# Patient Record
Sex: Female | Born: 1985 | Race: White | Hispanic: No | Marital: Married | Smoking: Never smoker
Health system: Southern US, Community
[De-identification: ages and names within clinical notes are randomized; demographics above are authoritative.]

## PROBLEM LIST (undated history)

## (undated) DIAGNOSIS — Z803 Family history of malignant neoplasm of breast: Secondary | ICD-10-CM

## (undated) DIAGNOSIS — F419 Anxiety disorder, unspecified: Secondary | ICD-10-CM

## (undated) DIAGNOSIS — G4700 Insomnia, unspecified: Secondary | ICD-10-CM

## (undated) DIAGNOSIS — L732 Hidradenitis suppurativa: Secondary | ICD-10-CM

## (undated) DIAGNOSIS — F32A Depression, unspecified: Secondary | ICD-10-CM

## (undated) DIAGNOSIS — F909 Attention-deficit hyperactivity disorder, unspecified type: Secondary | ICD-10-CM

## (undated) HISTORY — DX: Depression, unspecified: F32.A

## (undated) HISTORY — DX: Attention-deficit hyperactivity disorder, unspecified type: F90.9

## (undated) HISTORY — DX: Insomnia, unspecified: G47.00

## (undated) HISTORY — DX: Hidradenitis suppurativa: L73.2

## (undated) HISTORY — PX: LAPAROSCOPIC ABLATION OF UTERINE FIBROIDS WITH ACESSA: SHX7022

## (undated) HISTORY — DX: Anxiety disorder, unspecified: F41.9

## (undated) HISTORY — DX: Family history of malignant neoplasm of breast: Z80.3

---
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
## 2004-12-20 ENCOUNTER — Ambulatory Visit: Payer: Self-pay | Admitting: Internal Medicine

## 2005-01-03 ENCOUNTER — Ambulatory Visit: Payer: Self-pay | Admitting: Internal Medicine

## 2005-04-25 ENCOUNTER — Ambulatory Visit: Payer: Self-pay | Admitting: Internal Medicine

## 2005-05-16 ENCOUNTER — Ambulatory Visit: Payer: Self-pay | Admitting: Internal Medicine

## 2005-06-19 ENCOUNTER — Ambulatory Visit: Payer: Self-pay | Admitting: Internal Medicine

## 2005-06-19 ENCOUNTER — Encounter: Admission: RE | Admit: 2005-06-19 | Discharge: 2005-06-19 | Payer: Self-pay | Admitting: Internal Medicine

## 2005-08-15 ENCOUNTER — Ambulatory Visit: Payer: Self-pay | Admitting: Internal Medicine

## 2005-10-19 ENCOUNTER — Ambulatory Visit (HOSPITAL_COMMUNITY): Admission: RE | Admit: 2005-10-19 | Discharge: 2005-10-19 | Payer: Self-pay | Admitting: *Deleted

## 2005-11-10 ENCOUNTER — Ambulatory Visit (HOSPITAL_COMMUNITY): Admission: RE | Admit: 2005-11-10 | Discharge: 2005-11-10 | Payer: Self-pay | Admitting: *Deleted

## 2006-01-01 ENCOUNTER — Inpatient Hospital Stay (HOSPITAL_COMMUNITY): Admission: AD | Admit: 2006-01-01 | Discharge: 2006-01-01 | Payer: Self-pay | Admitting: Obstetrics and Gynecology

## 2006-04-05 ENCOUNTER — Inpatient Hospital Stay (HOSPITAL_COMMUNITY): Admission: AD | Admit: 2006-04-05 | Discharge: 2006-04-06 | Payer: Self-pay | Admitting: Obstetrics and Gynecology

## 2006-04-06 ENCOUNTER — Inpatient Hospital Stay (HOSPITAL_COMMUNITY): Admission: AD | Admit: 2006-04-06 | Discharge: 2006-04-09 | Payer: Self-pay | Admitting: Obstetrics and Gynecology

## 2006-09-17 ENCOUNTER — Ambulatory Visit: Payer: Self-pay | Admitting: Internal Medicine

## 2007-02-15 ENCOUNTER — Ambulatory Visit: Payer: Self-pay | Admitting: Internal Medicine

## 2007-04-01 IMAGING — US US OB FOLLOW-UP
1 series · 13 of 28 positions shown · non-contrast
Comparison: none

CLINICAL DATA: 18 week 3 day assigned gestational age by prior ultrasound.  Inaccurate LMP.  Evaluate fetal growth and anatomy.

[Series 1: us ob follow-up · 0.27mm/px · 13 of 94 slices shown]
[im 4/94]
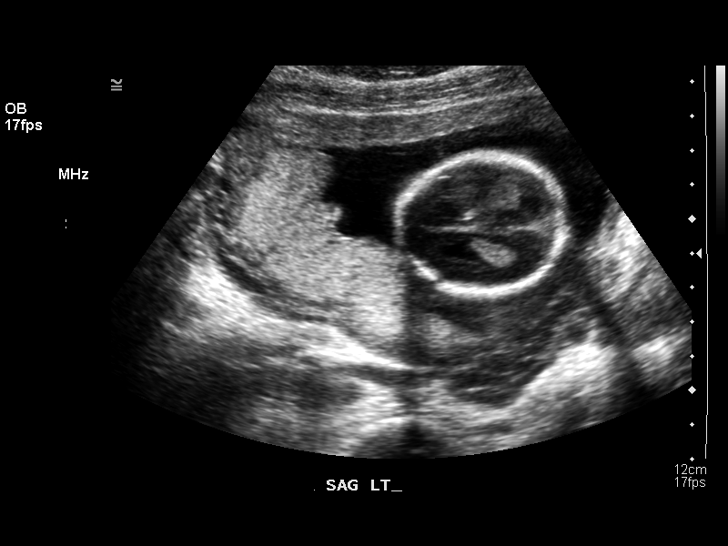
[im 11/94]
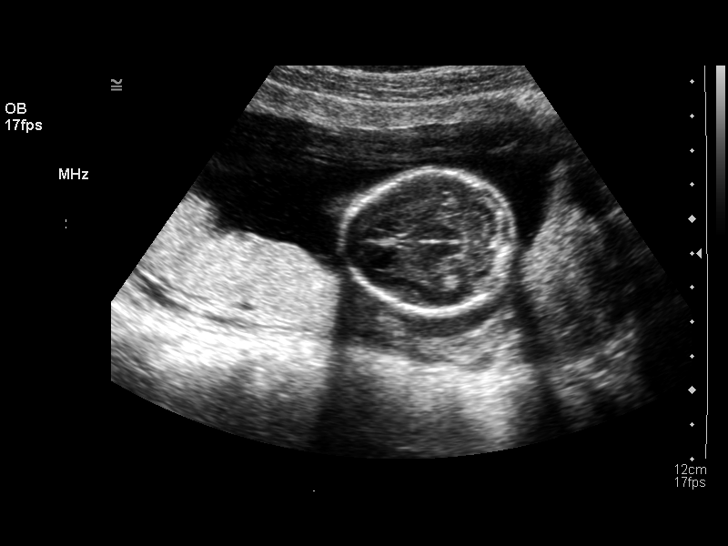
[im 18/94]
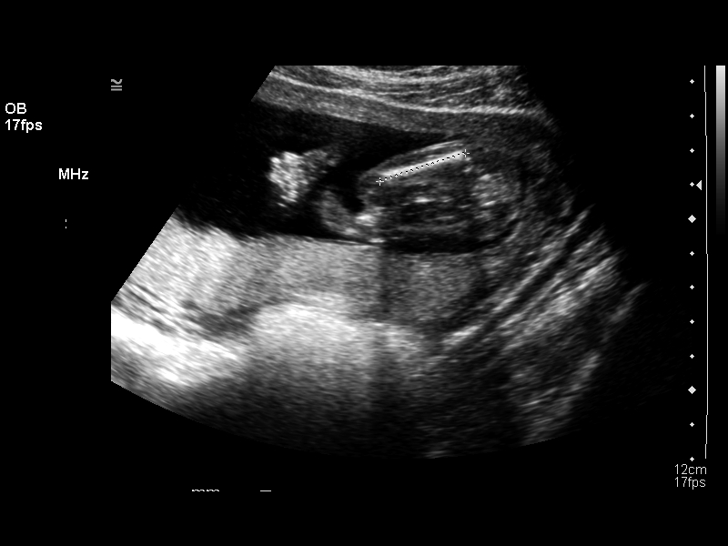
[im 25/94]
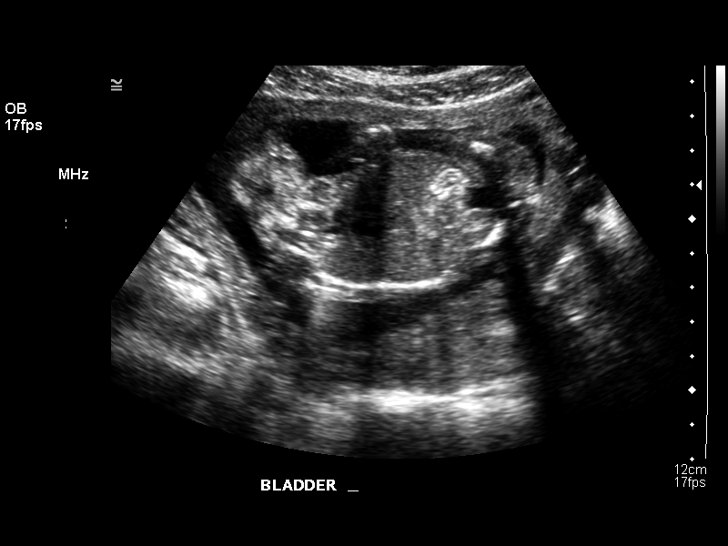
[im 32/94]
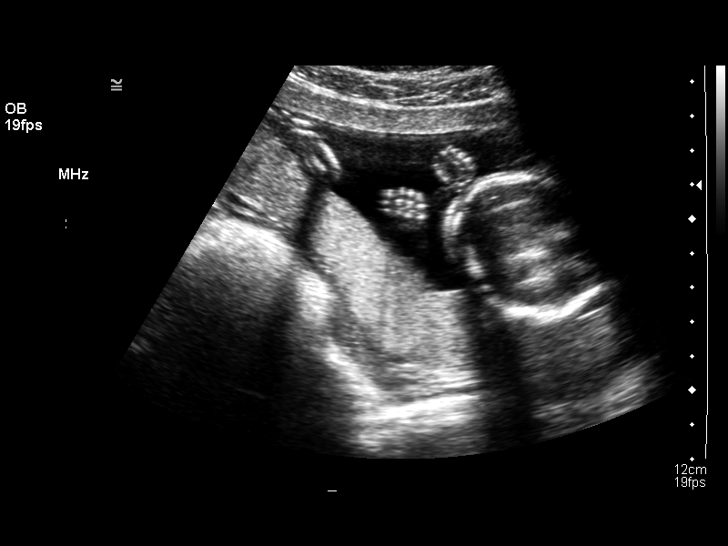
[im 38/94]
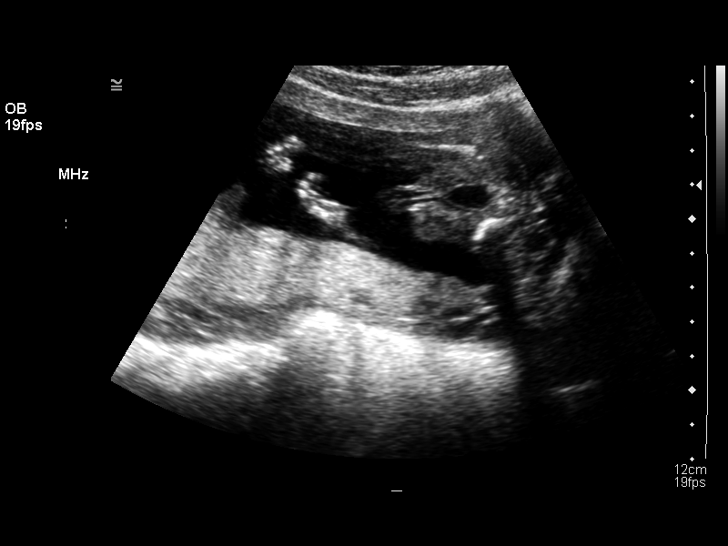
[im 49/94]
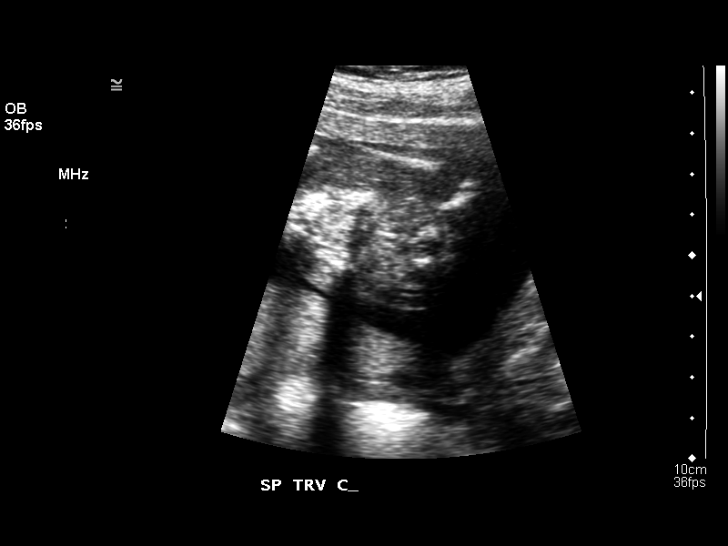
[im 56/94]
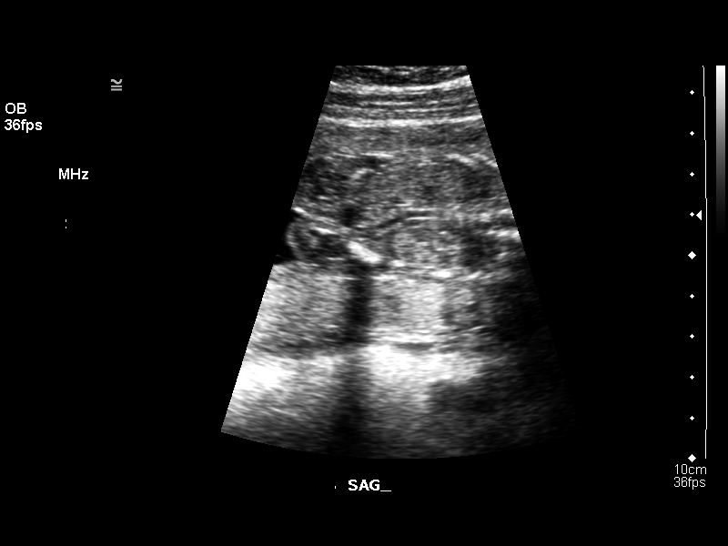
[im 63/94]
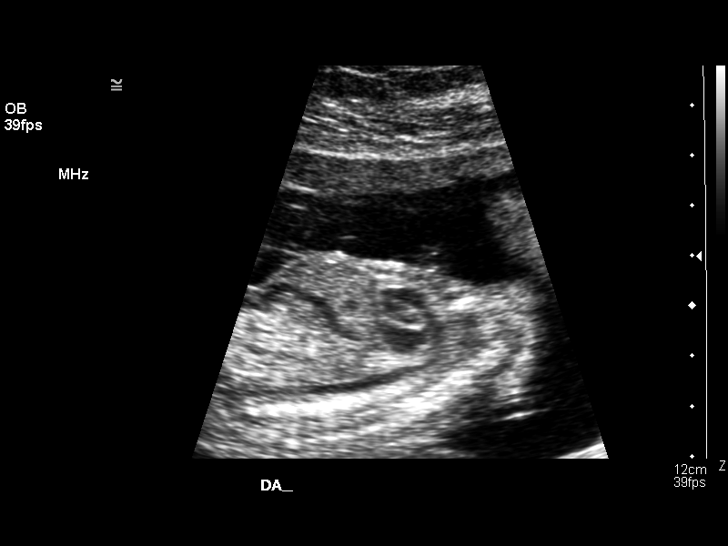
[im 69/94]
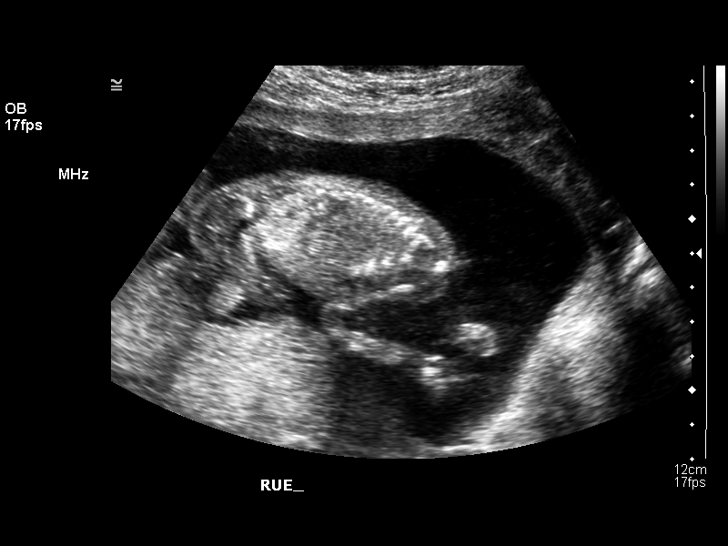
[im 76/94]
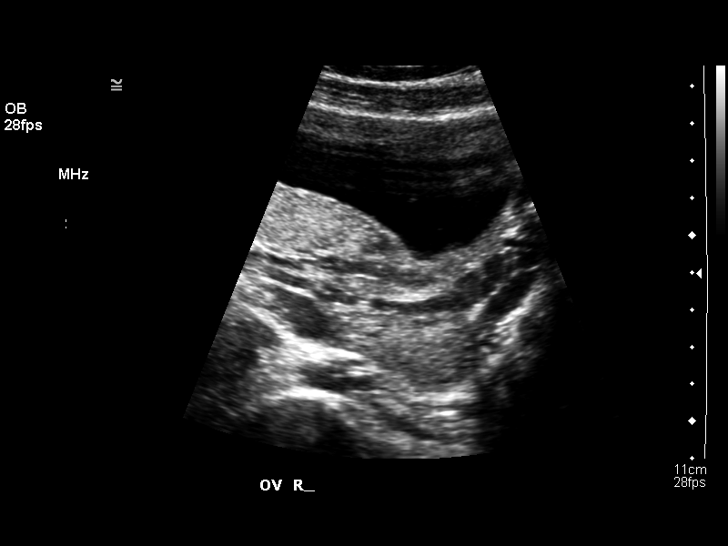
[im 83/94]
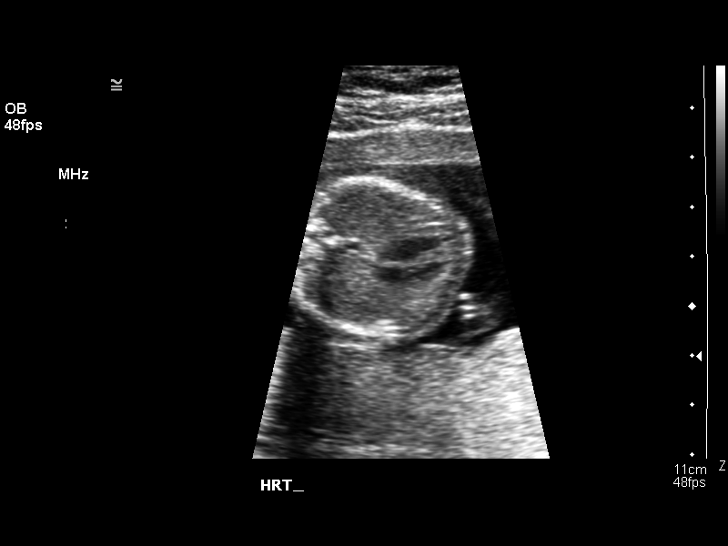
[im 90/94]
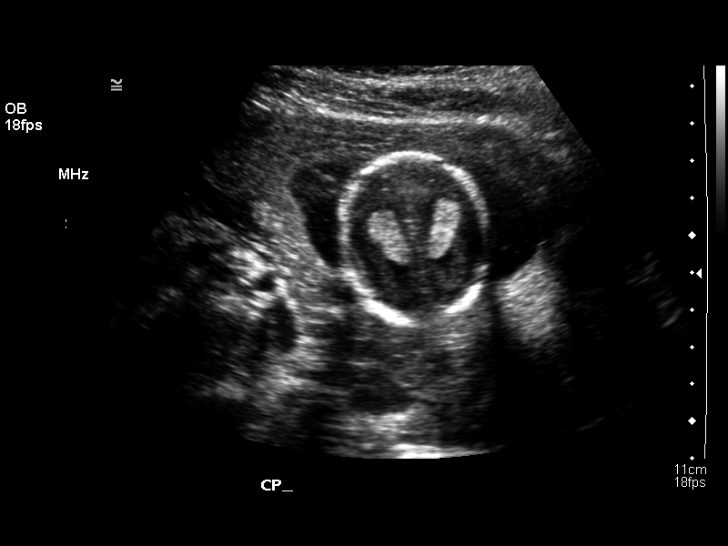

[13 of 28 positions shown; findings below may reference images not displayed]

OBSTETRICAL ULTRASOUND RE-EVALUATION:
 Number of Fetuses:  1
 Heart Rate:  133
 Movement:  Yes
 Breathing:  No
 Presentation:  Cephalic
 Placental Location:  Fundal, posterior
 Grade:  I
 Previa:  No
 Amniotic Fluid (subjective):  Normal
 Amniotic Fluid (objective):  4.5 cm Vertical pocket 

 FETAL BIOMETRY
 BPD:  4.0 cm  18 w 2 d
 HC:  14.9 cm  18 w 0 d
 AC:   12.9 cm   18 w 3 d
 FL:   2.6 cm  18 w 1 d

 Mean GA:  18 w 2 d
 Assigned GA:  18 w 3 d

 FETAL ANATOMY
 Lateral Ventricles:  Visualized 
 Thalami/CSP:  Visualized 
 Posterior Fossa:  Visualized 
 Nuchal Region:  Visualized 
 Spine:  Visualized 
 4 Chamber Heart on Left:  Visualized 
 Stomach on Left:  Visualized 
 3 Vessel Cord:  Visualized 
 Cord Insertion Site:  Visualized 
 Kidneys:  Visualized 
 Bladder:  Visualized   
 Extremities:  Visualized 

 ADDITIONAL ANATOMY VISUALIZED:  LVOT, RVOT, upper lip, orbits, profile, diaphragm, heel, 5th digit, ductal arch, aortic arch, and female genitalia.

 MATERNAL UTERINE AND ADNEXAL FINDINGS
 Cervix:  4.0 cm Transabdominally.  Both ovaries are unremarkable.
IMPRESSION: 1.  Assigned gestational age by prior ultrasound is currently 18 weeks 3 days.  Appropriate fetal growth.  
 2.  No evidence of fetal anatomic abnormality.

## 2007-07-20 ENCOUNTER — Ambulatory Visit: Payer: Self-pay | Admitting: Family Medicine

## 2007-10-04 ENCOUNTER — Ambulatory Visit: Payer: Self-pay | Admitting: Internal Medicine

## 2007-10-04 DIAGNOSIS — R599 Enlarged lymph nodes, unspecified: Secondary | ICD-10-CM | POA: Insufficient documentation

## 2007-10-04 DIAGNOSIS — M549 Dorsalgia, unspecified: Secondary | ICD-10-CM | POA: Insufficient documentation

## 2007-10-04 DIAGNOSIS — R109 Unspecified abdominal pain: Secondary | ICD-10-CM | POA: Insufficient documentation

## 2007-10-04 LAB — CONVERTED CEMR LAB
Glucose, Urine, Semiquant: NEGATIVE
Ketones, urine, test strip: NEGATIVE
Nitrite: NEGATIVE
Protein, U semiquant: NEGATIVE
Specific Gravity, Urine: 1.03
Urobilinogen, UA: 0.2
pH: 6

## 2007-10-09 ENCOUNTER — Telehealth: Payer: Self-pay | Admitting: *Deleted

## 2008-03-30 ENCOUNTER — Ambulatory Visit: Payer: Self-pay | Admitting: Internal Medicine

## 2008-06-12 ENCOUNTER — Ambulatory Visit: Payer: Self-pay | Admitting: Internal Medicine

## 2008-06-12 DIAGNOSIS — D485 Neoplasm of uncertain behavior of skin: Secondary | ICD-10-CM | POA: Insufficient documentation

## 2008-06-12 DIAGNOSIS — L708 Other acne: Secondary | ICD-10-CM | POA: Insufficient documentation

## 2008-10-29 ENCOUNTER — Ambulatory Visit: Payer: Self-pay | Admitting: Genetic Counselor

## 2009-12-29 ENCOUNTER — Ambulatory Visit: Payer: Self-pay | Admitting: Internal Medicine

## 2009-12-29 LAB — CONVERTED CEMR LAB
Bilirubin Urine: NEGATIVE
Blood in Urine, dipstick: NEGATIVE
Glucose, Urine, Semiquant: NEGATIVE
Ketones, urine, test strip: NEGATIVE
Nitrite: NEGATIVE
Protein, U semiquant: NEGATIVE
Specific Gravity, Urine: 1.025
Urobilinogen, UA: 0.2
WBC Urine, dipstick: NEGATIVE
pH: 5.5

## 2009-12-30 ENCOUNTER — Encounter: Payer: Self-pay | Admitting: Internal Medicine

## 2010-06-13 ENCOUNTER — Ambulatory Visit: Payer: Self-pay | Admitting: Internal Medicine

## 2010-06-13 DIAGNOSIS — M545 Low back pain, unspecified: Secondary | ICD-10-CM | POA: Insufficient documentation

## 2011-01-17 NOTE — Assessment & Plan Note (Signed)
Summary: back pain//ccm   Vital Signs:  Patient profile:   25 year old female Menstrual status:  regular Weight:      124 pounds Temp:     98.2 degrees F oral Pulse rate:   88 / minute BP sitting:   100 / 60  (right arm) Cuff size:   regular  Vitals Entered By: Romualdo Bolk, CMA (AAMA) (June 13, 2010 3:57 PM) CC: Back Spasms x 8 days from mid back down. Pt states that lower back is the worse part. No UTI sx.    History of Present Illness: Samantha Webster comes in today  for sda acute  problem. Back problems off and on for the last 10 years  this  last week flare and heard  a pop reading for shampoo and then reaching for  Newpaper.  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 No fever weight loss unintentional . pain    worse when moves in the night.  standing better  worse with certain sitting.  Dr Phineas Semen   did an eval in the remote past.     celebrex  has been used in the past.   Preventive Screening-Counseling & Management  Alcohol-Tobacco     Alcohol drinks/day: 1     Alcohol type: wine     Smoking Status: never  Caffeine-Diet-Exercise     Caffeine use/day: 1     Does Patient Exercise: no  Current Medications (verified): 1)  Lamictal 100 Mg  Tabs (Lamotrigine) .... Daily Am 2)  Alprazolam 1 Mg  Tabs (Alprazolam) .... Daily At [facility] 3)  Bactrim 400-80 Mg Tabs (Sulfamethoxazole-Trimethoprim) .Marland Kitchen.. 1 Two Times A Day 4)  Mirena 20 Mcg/24hr Iud (Levonorgestrel)  Allergies (verified): 1)  ! * Pcn-Amoxicillin  Past History:  Past medical, surgical, family and social histories (including risk factors) reviewed, and no changes noted (except as noted below).  Past Medical History: Reviewed history from 12/29/2009 and no changes required. childbirth 4/07 back pain serology hla b27 hnp l5s1 depression /add /anxiety  hx of   accutane med    Past Surgical History: Reviewed history from 10/04/2007 and no  changes required. c section  Past History:  Care Management: Dermatology: Terri Piedra Neurosurgery : [facility].   Family History: Reviewed history from 10/04/2007 and no changes required. Family History Breast cancer 1st degree relative <50 mom died from premenopausal breast cancer.  Social History: Reviewed history from 12/29/2009 and no changes required. 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  Review of Systems  The patient denies anorexia, fever, weight loss, weight gain, vision loss, decreased hearing, headaches, abdominal pain, severe indigestion/heartburn, hematuria, incontinence, genital sores, muscle weakness, transient blindness, enlarged lymph nodes, and angioedema.    Physical Exam  General:  Well-developed,well-nourished,in no acute distress; alert,appropriate and cooperative throughout examination prefers to stand Head:  normocephalic and atraumatic.   Eyes:  vision grossly intact.   Neck:  No deformities, masses, or tenderness noted. Lungs:  normal respiratory effort and no intercostal retractions.   Heart:  normal rate and regular rhythm.   Msk:  back no bony tenderness  tender al lb adn si area also  nl heel toe walk and nl strength and no  joint swelling  Extremities:  no clubbing cyanosis or edema  Neurologic:  alert & oriented X3, strength normal in all extremities, and DTRs symmetrical and normal.   neg slr  Skin:  turgor normal, color normal, and no rashes.   Cervical Nodes:  No lymphadenopathy noted Psych:  Oriented X3, not anxious appearing, and not depressed appearing.     Impression & Recommendations:  Problem # 1:  LOW BACK PAIN, ACUTE (ICD-724.2) hx of same but this  lasting longer    no other alarm signs . has used nsaid   will try pred   and further eval if  persistent or  progressive  She does have hla b27 but   mri in the past did not show   problem with si.  also no other systemic symptom   Problem # 2:  ? of BIPOLAR II DISORDER (ICD-296.89) stable   disc prednisone  and side effect possible .   Complete Medication List: 1)  Lamictal 100 Mg Tabs (Lamotrigine) .... Daily am 2)  Alprazolam 1 Mg Tabs (Alprazolam) .... Daily at hs 3)  Bactrim 400-80 Mg Tabs (Sulfamethoxazole-trimethoprim) .Marland Kitchen.. 1 two times a day 4)  Mirena 20 Mcg/24hr Iud (Levonorgestrel) 5)  Prednisone 20 Mg Tabs (Prednisone) .... Take 3 a day x 3 days then 2 a day x3 days then 1/day x 3 days, then 1/2 a day x 3 days or as directed.  Patient Instructions: 1)  try prednisone as an antiinflammatory  for your back and thus to help with the pain.  2)  avoid sitting    and  some lifting   3)  If not significantly better in the next 2 weeks then  call  for further advice  Prescriptions: PREDNISONE 20 MG TABS (PREDNISONE) Take 3 a day x 3 days then 2 a day x3 days then 1/day x 3 days, then 1/2 a day x 3 days or as directed.  #30 x 0   Entered and Authorized by:   Madelin Headings MD   Signed by:   Madelin Headings MD on 06/13/2010   Method used:   Electronically to        [facility] [phone]* (retail)       [address]       [place], [place]  [address]       Ph: [phone]       Fax: [phone]   RxID:   [id number]

## 2011-01-17 NOTE — Assessment & Plan Note (Signed)
Summary: uti/discomfort/cjr   Vital Signs:  Patient profile:   25 year old female Menstrual status:  regular LMP:     12/08/2009 Height:      63 inches Weight:      128 pounds BMI:     22.76 Temp:     98.2 degrees F oral Pulse rate:   72 / minute BP sitting:   100 / 60  (right arm) Cuff size:   regular  Vitals Entered By: Romualdo Bolk, CMA (AAMA) (December 29, 2009 1:52 PM) CC: Lower abd. pain that is going up. Alot of pressure since 1/10.  LMP (date): 12/08/2009     Menstrual Status regular Enter LMP: 12/08/2009   History of Present Illness: Samantha Webster comesin today  for  SDA  because of acute onset yesterday  of lovwer abd pain that she thinks is a bladder infection...this pain   doubled her over.  similar to last bladder  infection. No dysuria or frequency currently.   periods  ok on mirena and no vaginal symptom .  HAs reg gyne checks Pain began lower abd and then moved to middle . now is just tender. not as bad . ? if any change in bowel habits , no blood.  No meds taken for  treatment. Pattern is waxing and waning. On no new meds advil etc   Is on Bactrim  for acne  .  Since last visit she had lost her insurance and was on Accutane and stopped because of cost but no abd pain with this. now on antibiotic rx.    NO fever flank pain NVD. NO hematuria or hx of kidney infection.   Preventive Screening-Counseling & Management  Alcohol-Tobacco     Alcohol drinks/day: 1     Alcohol type: wine     Smoking Status: never  Caffeine-Diet-Exercise     Caffeine use/day: 1     Does Patient Exercise: no  Current Medications (verified): 1)  Lamictal 100 Mg  Tabs (Lamotrigine) .... Daily Am 2)  Alprazolam 1 Mg  Tabs (Alprazolam) .... Daily At [facility] 3)  Bactrim 400-80 Mg Tabs (Sulfamethoxazole-Trimethoprim) .Marland Kitchen.. 1 Two Times A Day  Allergies (verified): 1)  ! * Pcn-Amoxicillin  Past History:  Past medical, surgical, family and social histories (including risk factors)  reviewed, and no changes noted (except as noted below).  Past Medical History: childbirth 4/07 back pain serology hla b27 hnp l5s1 depression /add /anxiety  hx of   accutane med    Past Surgical History: Reviewed history from 10/04/2007 and no changes required. c section  Past History:  Care Management: Dermatology: Terri Piedra  Family History: Reviewed history from 10/04/2007 and no changes required. Family History Breast cancer 1st degree relative <50 mom died from premenopausal breast cancer.  Social History: Reviewed history from 06/12/2008 and no changes required. 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 Caffeine use/day:  1 Does Patient Exercise:  no  Review of Systems  The patient denies anorexia, fever, weight loss, weight gain, vision loss, decreased hearing, hoarseness, chest pain, headaches, melena, hematochezia, severe indigestion/heartburn, hematuria, incontinence, genital sores, muscle weakness, depression, unusual weight change, abnormal bleeding, enlarged lymph nodes, and angioedema.    Physical Exam  General:  Well-developed,well-nourished,in no acute distress; alert,appropriate and cooperative throughout examination Head:  normocephalic and atraumatic.  Eyes:  vision grossly intact and pupils equal.   Ears:  no external deformities.   Neck:  No deformities, masses, or tenderness noted. Lungs:  normal respiratory effort and no intercostal retractions.   Heart:  normal rate and regular rhythm.   Abdomen:  no flank pain    points  to   mid lower abdomen  no tenderness or rebound  no distention, no hepatomegaly, and no splenomegaly.   Pulses:  nl cap refill  Extremities:  no clubbing cyanosis or edema  Neurologic:  non focal grossly Skin:  faded acne on face  Cervical Nodes:  No lymphadenopathy noted Psych:  Oriented X3, good eye  contact, not anxious appearing, and not depressed appearing.     Impression & Recommendations:  Problem # 1:  ABDOMINAL PAIN (ICD-789.00) atypical for uti but pt states last uti acted the same way  . no vag or  other associated  symptom .  if continuing  and neg culture may need further evaluation.   Orders: T-Culture, Urine ([address]) UA Dipstick w/o Micro (automated)  (81003)  Problem # 2:  ACNE, CYSTIC (ICD-706.1)  Her updated medication list for this problem includes:    Bactrim 400-80 Mg Tabs (Sulfamethoxazole-trimethoprim) .Marland Kitchen... 1 two times a day    Cipro 500 Mg Tabs (Ciprofloxacin hcl) .Marland Kitchen... 1 by mouth two times a day if needed for uti  Problem # 3:  mood  stable on medication  sees psych.  Complete Medication List: 1)  Lamictal 100 Mg Tabs (Lamotrigine) .... Daily am 2)  Alprazolam 1 Mg Tabs (Alprazolam) .... Daily at hs 3)  Bactrim 400-80 Mg Tabs (Sulfamethoxazole-trimethoprim) .Marland Kitchen.. 1 two times a day 4)  Cipro 500 Mg Tabs (Ciprofloxacin hcl) .Marland Kitchen.. 1 by mouth two times a day if needed for uti 5)  Mirena 20 Mcg/24hr Iud (Levonorgestrel)  Patient Instructions: 1)  You will be informed of lab results when available.  2)   if progressing can begin  antibiotic for UTI. 3)  If getting diarrhea it is probably a stomach virus that antibiotics wont  help.  Call and keeus informed.  Prescriptions: CIPRO 500 MG TABS (CIPROFLOXACIN HCL) 1 by mouth two times a day if needed for UTI  #10 x 0   Entered and Authorized by:   Madelin Headings MD   Signed by:   Madelin Headings MD on 12/29/2009   Method used:   Print then Give to Patient   RxID:   [id number]   Laboratory Results   Urine Tests    Routine Urinalysis   Color: brown Appearance: Cloudy Glucose: negative   (Normal Range: Negative) Bilirubin: negative   (Normal Range: Negative) Ketone: negative   (Normal Range: Negative) Spec. Gravity: 1.025   (Normal Range: 1.003-1.035) Blood: negative   (Normal Range:  Negative) pH: 5.5   (Normal Range: 5.0-8.0) Protein: negative   (Normal Range: Negative) Urobilinogen: 0.2   (Normal Range: 0-1) Nitrite: negative   (Normal Range: Negative) Leukocyte Esterace: negative   (Normal Range: Negative)    Comments: Joanne Chars CMA  December 29, 2009 2:11 PM

## 2011-02-09 ENCOUNTER — Telehealth: Payer: Self-pay | Admitting: Internal Medicine

## 2011-02-09 NOTE — Telephone Encounter (Signed)
Triage vm----having another flare up from her herniated disc. Requesting rx for a muscle relaxer. She doesn't feel safe driving. Call [facility].       Her cell number---[phone]. Please return her call.

## 2011-02-09 NOTE — Telephone Encounter (Signed)
Left a message for pt to call back. Pt was never rx'd a muscle relaxer. We need more info.

## 2011-02-10 ENCOUNTER — Encounter: Payer: Self-pay | Admitting: Internal Medicine

## 2011-02-10 ENCOUNTER — Ambulatory Visit (INDEPENDENT_AMBULATORY_CARE_PROVIDER_SITE_OTHER): Payer: 59 | Admitting: Internal Medicine

## 2011-02-10 DIAGNOSIS — M549 Dorsalgia, unspecified: Secondary | ICD-10-CM

## 2011-02-10 MED ORDER — PREDNISONE 10 MG PO TABS: ORAL_TABLET | ORAL | Status: DC

## 2011-02-10 MED ORDER — CYCLOBENZAPRINE HCL 5 MG PO TABS: 5.0000 mg | ORAL_TABLET | Freq: Three times a day (TID) | ORAL | Status: AC | PRN

## 2011-02-10 MED ORDER — HYDROCODONE-ACETAMINOPHEN 5-500 MG PO TABS: 1.0000 | ORAL_TABLET | ORAL | Status: AC | PRN

## 2011-02-10 NOTE — Progress Notes (Signed)
  Subjective:    Patient ID: AVIVA WOLFER, female    DOB: 1986-07-30, 25 y.o.   MRN: [id number]  HPI  Pt presents to clinic for evaluation of back pain. Chart review indicates h/o chronic intermittent back pain. Pt states third episode of lbp within 2 months. Now with 3 day h/o lbp L>R with slight radiation to right upper leg. Notices intermittent mild numbness of upper right leg but denies focal weakness. No recent injury or trauma. Pain worsens with movement. Attempted nsaids as well as topical heat/ice without significant improvement. Denies other complaint.  Reviewed PMH, medications, and allergies.    Review of Systems  Musculoskeletal: Positive for back pain. Negative for joint swelling, arthralgias and gait problem.       Objective:   Physical Exam  Constitutional: She appears well-developed. No distress.  HENT:  Head: Normocephalic and atraumatic.  Right Ear: External ear normal.  Left Ear: External ear normal.  Nose: Nose normal.  Eyes: Conjunctivae are normal. No scleral icterus.  Musculoskeletal:       No midline ls tenderness or bony abnormality. No current overt paraspinal muscle spasm. Gait normal. Modified slr neg. Bilateral LE strength 5/5  Neurological: She is alert.  Skin: Skin is warm and dry. No rash noted. She is not diaphoretic. No erythema.  Psychiatric: She has a normal mood and affect.          Assessment & Plan:

## 2011-02-10 NOTE — Assessment & Plan Note (Signed)
Attempt prednisone taper as prescribed. Flexeril prn as well as short course of vicodin prn. Cautioned regarding possible sedating effect. Followup if no improvement or worsening.

## 2011-02-17 NOTE — Telephone Encounter (Signed)
Left message to call back  

## 2011-02-21 NOTE — Telephone Encounter (Signed)
Pt never called back.

## 2011-05-05 NOTE — Op Note (Signed)
NAME:  Samantha Webster, Samantha Webster           ACCOUNT NO.:  [id number]   MEDICAL RECORD NO.:  [id number]          PATIENT TYPE:  INP   LOCATION:  9113                          FACILITY:  [facility]   PHYSICIAN:  Michelle L. Grewal, M.D.DATE OF BIRTH:  12/02/1986   DATE OF PROCEDURE:  04/06/2006  DATE OF DISCHARGE:                                 OPERATIVE REPORT   PREOPERATIVE DIAGNOSIS:  Intrauterine pregnancy at term and arrest of  descent.   POSTOPERATIVE DIAGNOSIS:  Intrauterine pregnancy at term and arrest of  descent.   PROCEDURE:  Primary low transverse cesarean section.   SURGEON:  Michelle L. Vincente Poli, M.D.   ANESTHESIA:  Epidural.   SPECIMENS:  Female infant, Apgars 8 at 1 and 9 at 5 minutes.   ESTIMATED BLOOD LOSS:  500 cc.   COMPLICATIONS:  None.   PROCEDURE:  Patient was taken to the operating room. Her epidural was dosed.  She was then placed in the supine position with a leftward tilt. She was  prepped and draped in the usual sterile fashion. Foley catheter had  previously been placed on labor and delivery.  A low transverse incision was  made and carried down to the fascia. Fascia was scored in the midline and  extended laterally. The rectus muscles were separated in the midline. The  peritoneum was entered bluntly. Peritoneal incision was then stretched. The  bladder blade was inserted. The lower uterine segment was identified. The  bladder flap was created sharply and then digitally. The bladder blade was  readjusted. A low transverse incision was made in the uterus. Uterus was  entered using a hemostat. The baby was in OT position and was delivered  quite easily.  It was a female infant with Apgars 8 at 1 minute and 9 at 5  minutes. The cord was clamped and cut. The placenta was manually removed and  noted to be normal, intact, with three-vessel cord. The uterus cleared of  all clots and debris. The uterine incision was closed in one layer using 0  chromic in a continuous  running-locked stitch. Hemostasis was excellent.  The peritoneum was closed using 0 Vicryl, and the rectus muscles were  reapproximated as well. The fascia was closed using 0 Vicryl in continuous  running stitch. After irrigation of the subcutaneous layer, the skin was  closed with staples.  All sponge, lap, and instrument counts were correct  x2. The patient went to recovery room in stable condition.      Michelle L. Vincente Poli, M.D.  Electronically Signed     MLG/MEDQ  D:  04/07/2006  T:  04/09/2006  Job:  [id number]

## 2011-05-05 NOTE — Discharge Summary (Signed)
NAME:  Samantha Webster, Samantha Webster NO.:  [id number]   MEDICAL RECORD NO.:  [id number]          PATIENT TYPE:  INP   LOCATION:  9113                          FACILITY:  [facility]   PHYSICIAN:  Zelphia Cairo, MD    DATE OF BIRTH:  03/15/1986   DATE OF ADMISSION:  04/06/2006  DATE OF DISCHARGE:  04/09/2006                                 DISCHARGE SUMMARY   ADMITTING DIAGNOSIS:  1.  Intrauterine pregnancy at term.  2.  Spontaneous rupture of membranes.   DISCHARGE DIAGNOSIS:  1.  Status post low transverse cesarean section secondary to arrest of      descent.  2.  Viable female infant.   PROCEDURE:  Primary low transverse cesarean section.   REASON FOR ADMISSION:  Please see the written H&P.   HOSPITAL COURSE:  Patient is a 25 year old primigravida that was admitted to  [facility] at term with spontaneous rupture of membranes.  On admission the patient was noted to have some mild uterine contractions.  Vital signs were stable.  Fetal heart tones were reactive.  Contractions  were noted to be somewhat irregular.  Vaginal examination revealed cervix  dilated to 1 cm, positive rupture of membranes confirmed.  Patient was  started on Pitocin per protocol.  Patient did achieve complete dilatation  with vertex at a -1 one station.  Patient did continue to push for  approximately 30-45 minutes.  Patient was also noted to have maternal  elevation in temperature to 100.4.  Tylenol was administered and Unasyn was  started; however, patient made no further progress beyond a zero station.  A  decision was made to proceed with a primary low transverse cesarean section.  Patient was then transferred to the operating room where epidural was dosed  to an adequate surgical level.  Low transverse incision was made with  delivery of a viable female infant weighing 7 pounds 9 ounces with Apgars of  8 at one minute, 9 at five minutes.  Patient tolerated procedure well, was  taken  to the recovery room in stable condition.  On postoperative day one  patient was without complaint.  Vital signs were stable.  She was afebrile.  Abdomen soft.  Fundus was firm and nontender.  Abdominal dressing had been  removed revealing an incision that was clean, dry, and intact.  Laboratory  findings revealed hemoglobin of 10.2, WBC count of 12.9.  On postoperative  day two patient did desire early discharge.  Vital signs were stable.  She  was afebrile.  Abdomen soft.  Fundus was firm and nontender.  Incision was  clean, dry, and intact.  She was ambulating well.  Discharge instructions  reviewed and patient was discharged home.   CONDITION ON DISCHARGE:  Stable.   DIET:  Regular, as tolerated.   ACTIVITY:  No heavy lifting.  No driving x2 weeks.  No vaginal entry.   FOLLOW UP:  Patient to follow up in the office in one to two weeks for  staple removal.  She is to call for temperature greater than 100 degrees,  persistent nausea and vomiting, heavy vaginal bleeding,  and/or redness or  drainage from the incisional site.   DISCHARGE MEDICATIONS:  1.  Percocet 5/325 #30 one p.o. every four to six hours p.r.n.  2.  Motrin 600 mg every six hours p.r.n.  3.  Prenatal vitamins one p.o. daily.  4.  Colace one p.o. daily p.r.n.      Julio Sicks, N.P.      Zelphia Cairo, MD  Electronically Signed    CC/MEDQ  D:  04/30/2006  T:  04/30/2006  Job:  [id number]

## 2012-06-21 ENCOUNTER — Ambulatory Visit (INDEPENDENT_AMBULATORY_CARE_PROVIDER_SITE_OTHER): Payer: Self-pay | Admitting: Internal Medicine

## 2012-06-21 ENCOUNTER — Encounter: Payer: Self-pay | Admitting: Internal Medicine

## 2012-06-21 VITALS — BP 110/70 | HR 57 | Temp 98.6°F | Wt 140.0 lb

## 2012-06-21 DIAGNOSIS — M545 Low back pain, unspecified: Secondary | ICD-10-CM | POA: Insufficient documentation

## 2012-06-21 DIAGNOSIS — R1031 Right lower quadrant pain: Secondary | ICD-10-CM

## 2012-06-21 DIAGNOSIS — M5430 Sciatica, unspecified side: Secondary | ICD-10-CM

## 2012-06-21 DIAGNOSIS — M5431 Sciatica, right side: Secondary | ICD-10-CM

## 2012-06-21 MED ORDER — PREDNISONE 20 MG PO TABS: 20.0000 mg | ORAL_TABLET | Freq: Every day | ORAL | Status: AC

## 2012-06-21 NOTE — Patient Instructions (Addendum)
Go ahead and take the prednisone for this flareup. Someone will contact you about a sports medicine referral. Unsure if all of the sx are  back or some could be her hip girdle or performance muscle.  Hopefully there is nothing she can do to prevent flareups. Contact us in the meantime if there is fever severe worsening.

## 2012-06-21 NOTE — Progress Notes (Signed)
  Subjective:    Patient ID: JOHNIKA ESCARENO, female    DOB: 1986/08/01, 26 y.o.   MRN: [id number]  HPI Patient comes in today for SDA for  new problem evaluation. Last visit with me was over a year ago.  2011  Remote hx of back problem and has seen dr Jeral Fruit for this.    She was doing quite well up until about a week ago went she felt that her back flared up again she calls it a twinge and pain right back at this time going down through her buttocks and over to her groin. It has been difficult to sit and do other activities. No recent injury but has been very active physically recently she was in an active exercise program about 3 months ago did not remember any injury at that time. She's been taking ibuprofen 2-3 times a day 400-600 mg this has helped her in the past when she had a flareup and the feeling of nerve pinching on the right side however is continuing.  He is having some spasm in her back it is hard to cross legs and get out of a call or she has to lift her leg to do this. Her last flare was helped by prednisone. She's also had some tenderness or soreness underneath her right shoulder near the scapula laterally but no injury. No numbness in her arm.  Review of Systems No systemic symptoms unusual rashes new GI symptoms she sees Dr. 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 Past history family history social history reviewed in the electronic medical record. Her past history has not been abstracted  but is in the old electronic record. Reviewed    Objective:   Physical Exam BP 110/70  Pulse 57  Temp 98.6 F (37 C) (Oral)  Wt 140 lb (63.504 kg)  SpO2 96%  LMP 05/29/2012 Well-developed well-nourished in no acute distress she is uncomfortable sitting 90 on a table but gait is normal muscle strength of her lower sternum and uses normal. She points to the right buttocks area of concern and the right groin. She  has a mildly antalgic gait. Minimal SLR on stretching..  hip rotation seems okay. Lower extremity muscle strength is normal. Right shoulder good range of motion points to right lateral scapular area as area of soreness Oriented x 3. Normal cognition, attention, speech. Not anxious or depressed appearing   Good eye contact .    Assessment & Plan:   Acute low back buttocks sciatic distribution pain with history of same.  New groin pain right could be due to radiation versus other cause. Rule out hip related  Past history of neurosurgical evaluation on surgical problem.  Discussed options we'll give her prednisone course and arrange for sports medicine evaluation and also to give recommendations about exercises prevention in rehabilitation.   Done think the periscapular  discomfort is related

## 2012-07-18 ENCOUNTER — Ambulatory Visit: Payer: Self-pay | Admitting: Sports Medicine

## 2012-08-01 ENCOUNTER — Encounter: Payer: Self-pay | Admitting: Sports Medicine

## 2012-08-01 ENCOUNTER — Ambulatory Visit (INDEPENDENT_AMBULATORY_CARE_PROVIDER_SITE_OTHER): Payer: 59 | Admitting: Sports Medicine

## 2012-08-01 VITALS — BP 107/69 | HR 63 | Ht 64.0 in | Wt 133.2 lb

## 2012-08-01 DIAGNOSIS — M545 Low back pain, unspecified: Secondary | ICD-10-CM

## 2012-08-01 DIAGNOSIS — M5431 Sciatica, right side: Secondary | ICD-10-CM

## 2012-08-01 DIAGNOSIS — M5430 Sciatica, unspecified side: Secondary | ICD-10-CM

## 2012-08-01 NOTE — Progress Notes (Signed)
Samantha Webster is a 26 y.o. female who presents to [facility] today for evaluation of low back pain. She was sent by Dr. Fabian Sharp for evaluation and management. The patient has had low back pain for over 10 years now. Her pain is typically chronic and intermittent. Occasionally she has radicular pain.  She was last seen in July of 2013 by her primary care doctor for radicular pain to her right leg. This pain resolved with prednisone and hydrocodone. However she still has intermittent lumbar back pain that sometimes radiates.  Typically sneezing may cause brief radicular pain . She notes pain with some exercises and activities.  She denies any new weakness numbness difficulty walking or bowel or bladder dysfunction.    She would like to resume her activities which include running, elliptical, and a total body weight training/cardio activity reminiscent of cross fit.    PMH reviewed. History of low back pain previously evaluated by Dr. Jeral Fruit with an MRI over 12 years ago History  Substance Use Topics  . Smoking status: Never Smoker   . Smokeless tobacco: Not on file  . Alcohol Use: 4.2 oz/week    7 Glasses of wine per week   ROS as above otherwise neg   Exam:  BP 107/69  Pulse 63  Ht 5\' 4"  (1.626 m)  Wt 133 lb 4 oz (60.442 kg)  BMI 22.87 kg/m2  LMP 06/18/2012 Gen: Well NAD MSK: Back: Nontender over spinal midline. No paraspinal tenderness. No SI tenderness. Strength preserved to hip flexion abduction, adduction, knee extension, flexion ankle plantar and dorsiflexion great toe and plantar dorsiflexion.  Reflexes equal bilaterally.  Gait normal.  Straight leg raise normal.  Pearlean Brownie normal  Provocative testing does not cause any abnormal reflexes or show any signs of weakness

## 2012-08-01 NOTE — Patient Instructions (Addendum)
Thank you for coming in today. We would like to strengthen your back and core muscles.  Goal to reduce frequency and severity of back pain. 1) Knee to chest x 5 deep breaths  2) Knee to opposite shoulder  3) Pelvic tilt (tuck your hips back) 4) Back arch (upward facing dog) - go easy 5) Super man exercises Careful with heavy power lifting.  Come back as needed

## 2012-08-01 NOTE — Assessment & Plan Note (Signed)
Likely secondary to herniated disc at 26 years old which was likely traumatic in nature.   Discussed the importance of core and back strength. Gave patient 5 exercises to do. If pain continues to be intermittent (1-2 times a year) would recommend against chronic medication treatment. And use intermittent as needed medications. If pain becomes more frequent may use chronic medication. Followup if not improved.  Discussed warning signs or symptoms. Please see discharge instructions. Patient expresses understanding.

## 2012-08-01 NOTE — Assessment & Plan Note (Signed)
This resolved with her course of prednisone treatment  That suggests that she is less likely to have a degenerative disc issues

## 2013-03-25 ENCOUNTER — Encounter: Payer: Self-pay | Admitting: Internal Medicine

## 2013-03-25 ENCOUNTER — Ambulatory Visit (INDEPENDENT_AMBULATORY_CARE_PROVIDER_SITE_OTHER): Payer: Self-pay | Admitting: Internal Medicine

## 2013-03-25 VITALS — BP 108/72 | HR 76 | Temp 97.9°F | Wt 134.0 lb

## 2013-03-25 DIAGNOSIS — J069 Acute upper respiratory infection, unspecified: Secondary | ICD-10-CM

## 2013-03-25 DIAGNOSIS — J019 Acute sinusitis, unspecified: Secondary | ICD-10-CM

## 2013-03-25 DIAGNOSIS — Z803 Family history of malignant neoplasm of breast: Secondary | ICD-10-CM

## 2013-03-25 MED ORDER — CEFUROXIME AXETIL 500 MG PO TABS: 500.0000 mg | ORAL_TABLET | Freq: Two times a day (BID) | ORAL | Status: DC

## 2013-03-25 NOTE — Patient Instructions (Signed)
i thik  You have a sinuitis  Should improve in the next 3-5 days  Contact us if needed otherwise.   Sinusitis Sinusitis is redness, soreness, and swelling (inflammation) of the paranasal sinuses. Paranasal sinuses are air pockets within the bones of your face (beneath the eyes, the middle of the forehead, or above the eyes). In healthy paranasal sinuses, mucus is able to drain out, and air is able to circulate through them by way of your nose. However, when your paranasal sinuses are inflamed, mucus and air can become trapped. This can allow bacteria and other germs to grow and cause infection. Sinusitis can develop quickly and last only a short time (acute) or continue over a long period (chronic). Sinusitis that lasts for more than 12 weeks is considered chronic.  CAUSES  Causes of sinusitis include:  Allergies.  Structural abnormalities, such as displacement of the cartilage that separates your nostrils (deviated septum), which can decrease the air flow through your nose and sinuses and affect sinus drainage.  Functional abnormalities, such as when the small hairs (cilia) that line your sinuses and help remove mucus do not work properly or are not present. SYMPTOMS  Symptoms of acute and chronic sinusitis are the same. The primary symptoms are pain and pressure around the affected sinuses. Other symptoms include:  Upper toothache.  Earache.  Headache.  Bad breath.  Decreased sense of smell and taste.  A cough, which worsens when you are lying flat.  Fatigue.  Fever.  Thick drainage from your nose, which often is green and may contain pus (purulent).  Swelling and warmth over the affected sinuses. DIAGNOSIS  Your caregiver will perform a physical exam. During the exam, your caregiver may:  Look in your nose for signs of abnormal growths in your nostrils (nasal polyps).  Tap over the affected sinus to check for signs of infection.  View the inside of your sinuses  (endoscopy) with a special imaging device with a light attached (endoscope), which is inserted into your sinuses. If your caregiver suspects that you have chronic sinusitis, one or more of the following tests may be recommended:  Allergy tests.  Nasal culture A sample of mucus is taken from your nose and sent to a lab and screened for bacteria.  Nasal cytology A sample of mucus is taken from your nose and examined by your caregiver to determine if your sinusitis is related to an allergy. TREATMENT  Most cases of acute sinusitis are related to a viral infection and will resolve on their own within 10 days. Sometimes medicines are prescribed to help relieve symptoms (pain medicine, decongestants, nasal steroid sprays, or saline sprays).  However, for sinusitis related to a bacterial infection, your caregiver will prescribe antibiotic medicines. These are medicines that will help kill the bacteria causing the infection.  Rarely, sinusitis is caused by a fungal infection. In theses cases, your caregiver will prescribe antifungal medicine. For some cases of chronic sinusitis, surgery is needed. Generally, these are cases in which sinusitis recurs more than 3 times per year, despite other treatments. HOME CARE INSTRUCTIONS   Drink plenty of water. Water helps thin the mucus so your sinuses can drain more easily.  Use a humidifier.  Inhale steam 3 to 4 times a day (for example, sit in the bathroom with the shower running).  Apply a warm, moist washcloth to your face 3 to 4 times a day, or as directed by your caregiver.  Use saline nasal sprays to help moisten and  clean your sinuses.  Take over-the-counter or prescription medicines for pain, discomfort, or fever only as directed by your caregiver. SEEK IMMEDIATE MEDICAL CARE IF:  You have increasing pain or severe headaches.  You have nausea, vomiting, or drowsiness.  You have swelling around your face.  You have vision problems.  You  have a stiff neck.  You have difficulty breathing. MAKE SURE YOU:   Understand these instructions.  Will watch your condition.  Will get help right away if you are not doing well or get worse. Document Released: 12/04/2005 Document Revised: 02/26/2012 Document Reviewed: 12/19/2011 [facility] Patient Information 2013 [place], [place].

## 2013-03-25 NOTE — Progress Notes (Signed)
Chief Complaint  Patient presents with  . Sore Throat    Cough is productive.  Started 3 weeks ago.  . Neck Pain  . Cough  . Nasal Congestion    HPI: Patient comes in today for SDA for  new problem evaluation. Onset  About 3 weeks ago and thought was allergi but now getting worse with painful glands  Area.  An rocks in the throat and bad drainage  Right  Teeth pain and then when bends over   Feels worse.   Hurst when blows runny and clogged.  Worse at night.No fever ? Yesterday     ROS: See pertinent positives and negatives per HPI. Some cough for a hwil.   Tried cold allergy gargles and tylenol   Nothing worse.  Had rash with pcn as an infant no anaphylkaxis or other noted  Med allergies  Now works day care infants and sometimes toddlers . [employer]  One worker sick recently.    History reviewed. No pertinent past medical history.  Family History  Problem Relation Age of Onset  . Breast cancer Mother     deceased early onset.     History   Social History  . Marital Status: Single    Spouse Name: N/A    Number of Children: N/A  . Years of Education: N/A   Social History Main Topics  . Smoking status: Never Smoker   . Smokeless tobacco: None  . Alcohol Use: 4.2 oz/week    7 Glasses of wine per week  . Drug Use: No  . Sexually Active:    Other Topics Concern  . None   Social History Narrative   Married    Works day care infant some toddlers     Outpatient Encounter Prescriptions as of 03/25/2013  Medication Sig Dispense Refill  . ALPRAZolam (XANAX) 1 MG tablet Take 1 mg by mouth at bedtime as needed.        . lamoTRIgine (LAMICTAL) 150 MG tablet Take 150 mg by mouth daily.        Marland Kitchen levonorgestrel (MIRENA) 20 MCG/24HR IUD 1 each by Intrauterine route once.        . cefUROXime (CEFTIN) 500 MG tablet Take 1 tablet (500 mg total) by mouth 2 (two) times daily.  20 tablet  0   No facility-administered encounter medications on file as of 03/25/2013.    EXAM:  BP  108/72  Pulse 76  Temp(Src) 97.9 F (36.6 C) (Oral)  Wt 134 lb (60.782 kg)  BMI 22.99 kg/m2  SpO2 98%  LMP 03/03/2013  Body mass index is 22.99 kg/(m^2).  GENERAL: vitals reviewed and listed above, alert, oriented, appears well hydrated and in no acute distress  HEENT: atraumatic, conjunctiva  clear, no obvious abnormalities on inspection of external nose and ears  Nares congested   No sig face tenderness  OP : no lesion edema or exudate  But very red  No edema.   NECK: no obvious masses on inspection palpation  Very tender  Ac nodes  Shoddy pc   LUNGS: clear to auscultation bilaterally, no wheezes, rales or rhonchi, good air movement  CV: HRRR, no clubbing cyanosis or  peripheral edema nl cap refill   MS: moves all extremities without noticeable focal  abnormality  PSYCH: pleasant and cooperative, no obvious depression or anxiety  ASSESSMENT AND PLAN:  Discussed the following assessment and plan:  Acute sinusitis with symptoms greater than 10 days  Protracted URI - works in day care  Family hx-breast malignancy - mom deceased pre menopausal   Expectant management.  -Patient advised to return or notify health care team  if symptoms worsen or persist or new concerns arise.  Patient Instructions  i Lorinda Creed  You have a sinuitis  Should improve in the next 3-5 days  Contact us if needed otherwise.   Sinusitis Sinusitis is redness, soreness, and swelling (inflammation) of the paranasal sinuses. Paranasal sinuses are air pockets within the bones of your face (beneath the eyes, the middle of the forehead, or above the eyes). In healthy paranasal sinuses, mucus is able to drain out, and air is able to circulate through them by way of your nose. However, when your paranasal sinuses are inflamed, mucus and air can become trapped. This can allow bacteria and other germs to grow and cause infection. Sinusitis can develop quickly and last only a short time (acute) or continue over a  long period (chronic). Sinusitis that lasts for more than 12 weeks is considered chronic.  CAUSES  Causes of sinusitis include:  Allergies.  Structural abnormalities, such as displacement of the cartilage that separates your nostrils (deviated septum), which can decrease the air flow through your nose and sinuses and affect sinus drainage.  Functional abnormalities, such as when the small hairs (cilia) that line your sinuses and help remove mucus do not work properly or are not present. SYMPTOMS  Symptoms of acute and chronic sinusitis are the same. The primary symptoms are pain and pressure around the affected sinuses. Other symptoms include:  Upper toothache.  Earache.  Headache.  Bad breath.  Decreased sense of smell and taste.  A cough, which worsens when you are lying flat.  Fatigue.  Fever.  Thick drainage from your nose, which often is green and may contain pus (purulent).  Swelling and warmth over the affected sinuses. DIAGNOSIS  Your caregiver will perform a physical exam. During the exam, your caregiver may:  Look in your nose for signs of abnormal growths in your nostrils (nasal polyps).  Tap over the affected sinus to check for signs of infection.  View the inside of your sinuses (endoscopy) with a special imaging device with a light attached (endoscope), which is inserted into your sinuses. If your caregiver suspects that you have chronic sinusitis, one or more of the following tests may be recommended:  Allergy tests.  Nasal culture A sample of mucus is taken from your nose and sent to a lab and screened for bacteria.  Nasal cytology A sample of mucus is taken from your nose and examined by your caregiver to determine if your sinusitis is related to an allergy. TREATMENT  Most cases of acute sinusitis are related to a viral infection and will resolve on their own within 10 days. Sometimes medicines are prescribed to help relieve symptoms (pain medicine,  decongestants, nasal steroid sprays, or saline sprays).  However, for sinusitis related to a bacterial infection, your caregiver will prescribe antibiotic medicines. These are medicines that will help kill the bacteria causing the infection.  Rarely, sinusitis is caused by a fungal infection. In theses cases, your caregiver will prescribe antifungal medicine. For some cases of chronic sinusitis, surgery is needed. Generally, these are cases in which sinusitis recurs more than 3 times per year, despite other treatments. HOME CARE INSTRUCTIONS   Drink plenty of water. Water helps thin the mucus so your sinuses can drain more easily.  Use a humidifier.  Inhale steam 3 to 4 times a day (for example, sit  in the bathroom with the shower running).  Apply a warm, moist washcloth to your face 3 to 4 times a day, or as directed by your caregiver.  Use saline nasal sprays to help moisten and clean your sinuses.  Take over-the-counter or prescription medicines for pain, discomfort, or fever only as directed by your caregiver. SEEK IMMEDIATE MEDICAL CARE IF:  You have increasing pain or severe headaches.  You have nausea, vomiting, or drowsiness.  You have swelling around your face.  You have vision problems.  You have a stiff neck.  You have difficulty breathing. MAKE SURE YOU:   Understand these instructions.  Will watch your condition.  Will get help right away if you are not doing well or get worse. Document Released: 12/04/2005 Document Revised: 02/26/2012 Document Reviewed: 12/19/2011 [facility] Patient Information 2013 [place], [place].      Neta Mends. Zenaida Tesar M.D.

## 2013-12-27 ENCOUNTER — Ambulatory Visit: Payer: Self-pay | Admitting: Family Medicine

## 2014-01-05 ENCOUNTER — Ambulatory Visit (INDEPENDENT_AMBULATORY_CARE_PROVIDER_SITE_OTHER): Payer: BC Managed Care – PPO | Admitting: Internal Medicine

## 2014-01-05 ENCOUNTER — Encounter: Payer: Self-pay | Admitting: Internal Medicine

## 2014-01-05 VITALS — BP 110/70 | HR 89 | Temp 98.9°F | Wt 134.0 lb

## 2014-01-05 DIAGNOSIS — J019 Acute sinusitis, unspecified: Secondary | ICD-10-CM

## 2014-01-05 MED ORDER — CEFUROXIME AXETIL 500 MG PO TABS: 500.0000 mg | ORAL_TABLET | Freq: Two times a day (BID) | ORAL | Status: DC

## 2014-01-05 NOTE — Progress Notes (Signed)
Pre visit review using our clinic review tool, if applicable. No additional management support is needed unless otherwise documented below in the visit note. Chief Complaint  Patient presents with  . Sinusitis    HPI: Patient comes in today for SDA for  new problem evaluation. Prolonged uri and now pain and continuing without improvement   2 weeks colored gunk drainage    Face pain persistent pain when bends over. bilateral frontal and maxillary area  Fever last week .  Flu like   Now better tylelol cold and other otc,    ROS: See pertinent positives and negatives per HPI. No cp sob   No past medical history on file.  Family History  Problem Relation Age of Onset  . Breast cancer Mother     deceased early onset.     History   Social History  . Marital Status: Single    Spouse Name: N/A    Number of Children: N/A  . Years of Education: N/A   Social History Main Topics  . Smoking status: Never Smoker   . Smokeless tobacco: None  . Alcohol Use: 4.2 oz/week    7 Glasses of wine per week  . Drug Use: No  . Sexual Activity:    Other Topics Concern  . None   Social History Narrative   Married    Works day care infant some toddlers     Outpatient Encounter Prescriptions as of 01/05/2014  Medication Sig  . ALPRAZolam (XANAX) 1 MG tablet Take 1 mg by mouth at bedtime as needed.    . lamoTRIgine (LAMICTAL) 150 MG tablet Take 150 mg by mouth daily.    Marland Kitchen levonorgestrel (MIRENA) 20 MCG/24HR IUD 1 each by Intrauterine route once.    . cefUROXime (CEFTIN) 500 MG tablet Take 1 tablet (500 mg total) by mouth 2 (two) times daily.  . [DISCONTINUED] cefUROXime (CEFTIN) 500 MG tablet Take 1 tablet (500 mg total) by mouth 2 (two) times daily.    EXAM:  BP 110/70  Pulse 89  Temp(Src) 98.9 F (37.2 C) (Oral)  Wt 134 lb (60.782 kg)  SpO2 99%  Body mass index is 22.99 kg/(m^2). WDWN in NAD  quiet respirations; mildly congested  somewhat hoarse. Non toxic . HEENT: Normocephalic  ;atraumatic , Eyes;  PERRL, EOMs  Full, lids and conjunctiva clear,,Ears: no deformities, canals nl, TM landmarks normal, Nose: no deformity or discharge but congested;face frontal  tender Mouth : OP clear without lesion or edema . Neck: Supple without adenopathy or masses or bruits Chest:  Clear to A&P without wheezes rales or rhonchi CV:  S1-S2 no gallops or murmurs peripheral perfusion is normal Skin :nl perfusion and no acute rashes  Here with daughter  For visit  ASSESSMENT AND PLAN:  Discussed the following assessment and plan:  Acute sinusitis with symptoms greater than 10 days - 2 weeks and persists [place] measures antibiotic contact  if persistent Had rash with pcn as a child  Able to take cephalosporin in past. -Patient advised to return or notify health care team  if symptoms worsen or persist or new concerns arise.  Patient Instructions    Sinusitis Sinusitis is redness, soreness, and swelling (inflammation) of the paranasal sinuses. Paranasal sinuses are air pockets within the bones of your face (beneath the eyes, the middle of the forehead, or above the eyes). In healthy paranasal sinuses, mucus is able to drain out, and air is able to circulate through them by way of your nose.  However, when your paranasal sinuses are inflamed, mucus and air can become trapped. This can allow bacteria and other germs to grow and cause infection. Sinusitis can develop quickly and last only a short time (acute) or continue over a long period (chronic). Sinusitis that lasts for more than 12 weeks is considered chronic.  CAUSES  Causes of sinusitis include:  Allergies.  Structural abnormalities, such as displacement of the cartilage that separates your nostrils (deviated septum), which can decrease the air flow through your nose and sinuses and affect sinus drainage.  Functional abnormalities, such as when the small hairs (cilia) that line your sinuses and help remove mucus do not work properly  or are not present. SYMPTOMS  Symptoms of acute and chronic sinusitis are the same. The primary symptoms are pain and pressure around the affected sinuses. Other symptoms include:  Upper toothache.  Earache.  Headache.  Bad breath.  Decreased sense of smell and taste.  A cough, which worsens when you are lying flat.  Fatigue.  Fever.  Thick drainage from your nose, which often is green and may contain pus (purulent).  Swelling and warmth over the affected sinuses. DIAGNOSIS  Your caregiver will perform a physical exam. During the exam, your caregiver may:  Look in your nose for signs of abnormal growths in your nostrils (nasal polyps).  Tap over the affected sinus to check for signs of infection.  View the inside of your sinuses (endoscopy) with a special imaging device with a light attached (endoscope), which is inserted into your sinuses. If your caregiver suspects that you have chronic sinusitis, one or more of the following tests may be recommended:  Allergy tests.  Nasal culture A sample of mucus is taken from your nose and sent to a lab and screened for bacteria.  Nasal cytology A sample of mucus is taken from your nose and examined by your caregiver to determine if your sinusitis is related to an allergy. TREATMENT  Most cases of acute sinusitis are related to a viral infection and will resolve on their own within 10 days. Sometimes medicines are prescribed to help relieve symptoms (pain medicine, decongestants, nasal steroid sprays, or saline sprays).  However, for sinusitis related to a bacterial infection, your caregiver will prescribe antibiotic medicines. These are medicines that will help kill the bacteria causing the infection.  Rarely, sinusitis is caused by a fungal infection. In theses cases, your caregiver will prescribe antifungal medicine. For some cases of chronic sinusitis, surgery is needed. Generally, these are cases in which sinusitis recurs more  than 3 times per year, despite other treatments. HOME CARE INSTRUCTIONS   Drink plenty of water. Water helps thin the mucus so your sinuses can drain more easily.  Use a humidifier.  Inhale steam 3 to 4 times a day (for example, sit in the bathroom with the shower running).  Apply a warm, moist washcloth to your face 3 to 4 times a day, or as directed by your caregiver.  Use saline nasal sprays to help moisten and clean your sinuses.  Take over-the-counter or prescription medicines for pain, discomfort, or fever only as directed by your caregiver. SEEK IMMEDIATE MEDICAL CARE IF:  You have increasing pain or severe headaches.  You have nausea, vomiting, or drowsiness.  You have swelling around your face.  You have vision problems.  You have a stiff neck.  You have difficulty breathing. MAKE SURE YOU:   Understand these instructions.  Will watch your condition.  Will get help  right away if you are not doing well or get worse. Document Released: 12/04/2005 Document Revised: 02/26/2012 Document Reviewed: 12/19/2011 [facility] Patient Information 2014 [place], [place].      Standley Brooking. Panosh M.D.

## 2014-01-05 NOTE — Patient Instructions (Signed)

## 2016-12-21 ENCOUNTER — Encounter: Payer: Self-pay | Admitting: Adult Health

## 2016-12-21 ENCOUNTER — Ambulatory Visit (INDEPENDENT_AMBULATORY_CARE_PROVIDER_SITE_OTHER): Payer: BLUE CROSS/BLUE SHIELD | Admitting: Adult Health

## 2016-12-21 VITALS — BP 112/64 | Temp 98.1°F | Wt 154.2 lb

## 2016-12-21 DIAGNOSIS — H109 Unspecified conjunctivitis: Secondary | ICD-10-CM | POA: Diagnosis not present

## 2016-12-21 MED ORDER — ERYTHROMYCIN 5 MG/GM OP OINT: TOPICAL_OINTMENT | OPHTHALMIC | 0 refills | Status: DC

## 2016-12-21 NOTE — Progress Notes (Signed)
   Subjective:    Patient ID: Samantha Webster, female    DOB: 1986-02-26, 31 y.o.   MRN: [id number]  HPI  31 year old female who presents to the office today for conjunctivitis. She reports that she woke up this morning with her right eye matted shut, purulent drainage, eye itching and redness. As the day has gone on, her itchiness has increased. She has a watery discharge presently.   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   Eyes: Positive for discharge, redness and itching. Negative for photophobia, pain and visual disturbance.  Respiratory: Negative.   All other systems reviewed and are negative.  No past medical history on file.  Social History   Social History  . Marital status: Single    Spouse name: N/A  . Number of children: N/A  . Years of education: N/A   Occupational History  . Not on file.   Social History Main Topics  . Smoking status: Never Smoker  . Smokeless tobacco: Not on file  . Alcohol use 4.2 oz/week    7 Glasses of wine per week  . Drug use: No  . Sexual activity: Not on file   Other Topics Concern  . Not on file   Social History Narrative   Married    Works day care infant some toddlers     No past surgical history on file.  Family History  Problem Relation Age of Onset  . Breast cancer Mother     deceased early onset.     Allergies  Allergen Reactions  . Penicillins     REACTION: CHILDHOOD    No current outpatient prescriptions on file prior to visit.   No current facility-administered medications on file prior to visit.     BP 112/64   Temp 98.1 F (36.7 C) (Oral)   Wt 154 lb 3.2 oz (69.9 kg)   BMI 26.47 kg/m       Objective:   Physical Exam  Constitutional: She is oriented to person, place, and time. She appears well-developed and well-nourished. No distress.  HENT:  Head: Normocephalic and atraumatic.  Right Ear: External  ear normal.  Left Ear: External ear normal.  Nose: Nose normal.  Mouth/Throat: Oropharynx is clear and moist. No oropharyngeal exudate.  Cardiovascular: Normal rate, regular rhythm, normal heart sounds and intact distal pulses.   Pulmonary/Chest: Effort normal and breath sounds normal. No respiratory distress. She has no wheezes. She has no rales.  Neurological: She is alert and oriented to person, place, and time.  Skin: Skin is warm and dry. No rash noted. She is not diaphoretic. No erythema. No pallor.  Psychiatric: She has a normal mood and affect. Her behavior is normal. Judgment and thought content normal.  Nursing note and vitals reviewed.     Assessment & Plan:  1. Conjunctivitis of right eye, unspecified conjunctivitis type - Will treat for bacterial  - erythromycin [facility]) ophthalmic ointment; Apply thin ribbon to affected eye(s) once daily for 7 days.  Dispense: 1 g; Refill: 0 - Wash hands  - Follow up if no improvement  Dorothyann Peng, NP

## 2017-06-04 ENCOUNTER — Encounter: Payer: Self-pay | Admitting: Family Medicine

## 2017-06-04 ENCOUNTER — Ambulatory Visit (INDEPENDENT_AMBULATORY_CARE_PROVIDER_SITE_OTHER): Payer: BLUE CROSS/BLUE SHIELD | Admitting: Family Medicine

## 2017-06-04 VITALS — BP 110/76 | HR 66 | Temp 98.4°F | Wt 143.2 lb

## 2017-06-04 DIAGNOSIS — J0100 Acute maxillary sinusitis, unspecified: Secondary | ICD-10-CM

## 2017-06-04 DIAGNOSIS — L209 Atopic dermatitis, unspecified: Secondary | ICD-10-CM

## 2017-06-04 MED ORDER — AZITHROMYCIN 250 MG PO TABS: ORAL_TABLET | ORAL | 0 refills | Status: DC

## 2017-06-04 NOTE — Patient Instructions (Signed)
Please take medication as directed and follow up if symptoms do not improve with treatment, worsen, or you develop a fever >100.   Sinusitis, Adult Sinusitis is soreness and inflammation of your sinuses. Sinuses are hollow spaces in the bones around your face. They are located:  Around your eyes.  In the middle of your forehead.  Behind your nose.  In your cheekbones.  Your sinuses and nasal passages are lined with a stringy fluid (mucus). Mucus normally drains out of your sinuses. When your nasal tissues get inflamed or swollen, the mucus can get trapped or blocked so air cannot flow through your sinuses. This lets bacteria, viruses, and funguses grow, and that leads to infection. Follow these instructions at home: Medicines  Take, use, or apply over-the-counter and prescription medicines only as told by your doctor. These may include nasal sprays.  If you were prescribed an antibiotic medicine, take it as told by your doctor. Do not stop taking the antibiotic even if you start to feel better. Hydrate and Humidify  Drink enough water to keep your pee (urine) clear or pale yellow.  Use a cool mist humidifier to keep the humidity level in your home above 50%.  Breathe in steam for 10-15 minutes, 3-4 times a day or as told by your doctor. You can do this in the bathroom while a hot shower is running.  Try not to spend time in cool or dry air. Rest  Rest as much as possible.  Sleep with your head raised (elevated).  Make sure to get enough sleep each night. General instructions  Put a warm, moist washcloth on your face 3-4 times a day or as told by your doctor. This will help with discomfort.  Wash your hands often with soap and water. If there is no soap and water, use hand sanitizer.  Do not smoke. Avoid being around people who are smoking (secondhand smoke).  Keep all follow-up visits as told by your doctor. This is important. Contact a doctor if:  You have a  fever.  Your symptoms get worse.  Your symptoms do not get better within 10 days. Get help right away if:  You have a very bad headache.  You cannot stop throwing up (vomiting).  You have pain or swelling around your face or eyes.  You have trouble seeing.  You feel confused.  Your neck is stiff.  You have trouble breathing. This information is not intended to replace advice given to you by your health care provider. Make sure you discuss any questions you have with your health care provider. Document Released: 05/22/2008 Document Revised: 07/30/2016 Document Reviewed: 09/29/2015 Elsevier Interactive Patient Education  [employer].

## 2017-06-04 NOTE — Progress Notes (Signed)
Patient ID: Samantha Webster, female   DOB: Dec 09, 1986, 31 y.o.   MRN: [id number]  PCP: Burnis Medin, MD  Subjective:  Samantha Webster is a 31 y.o. year old very pleasant female patient who presents with symptoms including nasal congestion, cough productive with thick, yellow sputum. Cough is worse at night. 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 None Associated symptoms: None Denies: fever, chills sweats, myalgias, difficulty swallowing, hoarseness, SOB, N/V, abdominal pain, or tightening of throat.  No new exposures of soaps, lotions, laundry detergent, fabric softeners, foods, medications, plants, animals, or insects.  Treatment at home includes coconut oil which has provided great benefit.   Medications- reviewed  No current outpatient prescriptions on file.   No current facility-administered medications for this visit.     Objective: BP 110/76 (BP Location: Left Arm, Patient Position: Sitting, Cuff Size: Normal)   Pulse 66   Temp 98.4 F (36.9 C) (Oral)   Wt 143 lb 3.2 oz (65 kg)   SpO2 99%   BMI 24.58 kg/m  Gen: NAD, resting comfortably HEENT: Turbinates erythematous, TM normal, pharynx mildly erythematous with no tonsilar exudate or edema, positive maxillary sinus tenderness CV: RRR no murmurs rubs or gallops Lungs: CTAB no crackles, wheeze, rhonchi Abdomen: soft/nontender/nondistended/normal bowel sounds. No  rebound or guarding.  Ext: no edema Skin: warm, dry, no rash Neuro: grossly normal, moves all extremities  Assessment/Plan: 1. Acute maxillary sinusitis, recurrence not specified Duration of treatment with worsening symptoms; will treat with antibiotic therapy. History of PCN allergy and she is trying to become pregnant has limited options for treatment.  We discussed use of azithromycin and I did review that if symptoms do not improve with treatment, advise follow up and another option can be considered if pregnancy is ruled out. - azithromycin (ZITHROMAX) 250 MG tablet; Take 2 tablets at once today, then take one tablet daily for 4 days  Dispense: 6 tablet; Refill: 0  2. Atopic dermatitis, unspecified type Hydration with coconut oil has provided excellent benefit. Reviewed the importance of hydration and applying moisturizer such as Eucerin, Cetaphil, or Aveeno directly after showering and drying off for most benefit. If symptoms do not improve with treatment, steroid cream can be considered; we agreed to hydration first as she is trying to conceive.  Follow up if symptoms do not improve with treatment in 3 to 4 days, worsen, or fever >100 develops or new symptoms.   Laurita Quint, FNP

## 2018-01-16 ENCOUNTER — Encounter: Payer: Self-pay | Admitting: Family Medicine

## 2018-01-16 ENCOUNTER — Ambulatory Visit: Payer: 59 | Admitting: Family Medicine

## 2018-01-16 VITALS — BP 104/72 | HR 73 | Temp 98.4°F | Wt 149.6 lb

## 2018-01-16 DIAGNOSIS — M5440 Lumbago with sciatica, unspecified side: Secondary | ICD-10-CM | POA: Diagnosis not present

## 2018-01-16 MED ORDER — CYCLOBENZAPRINE HCL 10 MG PO TABS: 10.0000 mg | ORAL_TABLET | Freq: Three times a day (TID) | ORAL | 1 refills | Status: DC | PRN

## 2018-01-16 MED ORDER — METHYLPREDNISOLONE 4 MG PO TBPK: ORAL_TABLET | ORAL | 0 refills | Status: DC

## 2018-01-16 NOTE — Progress Notes (Signed)
   Subjective:    Patient ID: Samantha Webster, female    DOB: August 13, 1986, 32 y.o.   MRN: [id number]  HPI Here for 2 days of lower back pain. She has a known herniated disc and once or twice a year she will have this flare up. 2 days ago while at home she was bending over to get something out of a drawer and she felt the sudden onset of sharp pain in the left lower back. The pain has persisted since then, and she feels a lot of spasm in the lower back. The pain radiates to the left buttock and upper thigh. No weakness or numbness. She has tried ice, heat, and Ibuprofen with poor results.    Review of Systems  Constitutional: Negative.   Respiratory: Negative.   Cardiovascular: Negative.   Musculoskeletal: Positive for back pain.  Neurological: Negative for weakness and numbness.       Objective:   Physical Exam  Constitutional: She is oriented to person, place, and time. She appears well-developed and well-nourished.  In pain   Cardiovascular: Normal rate, regular rhythm, normal heart sounds and intact distal pulses.  Pulmonary/Chest: Effort normal and breath sounds normal. No respiratory distress. She has no wheezes. She has no rales.  Musculoskeletal:  She is tender over the left lower back and left sciatic notch. Mild spasm is present. ROM is full. SLR are negative   Neurological: She is alert and oriented to person, place, and time.          Assessment & Plan:  Low back pain. She will use moist heat and stretches. Given a Medrol dose pack and Flexeril. She will stay home from work today. Recheck prn.  Alysia Penna, MD

## 2018-01-28 ENCOUNTER — Inpatient Hospital Stay (HOSPITAL_BASED_OUTPATIENT_CLINIC_OR_DEPARTMENT_OTHER): Payer: 59 | Admitting: Genetic Counselor

## 2018-01-28 ENCOUNTER — Ambulatory Visit: Payer: 59 | Admitting: Internal Medicine

## 2018-01-28 ENCOUNTER — Encounter: Payer: Self-pay | Admitting: Genetic Counselor

## 2018-01-28 ENCOUNTER — Inpatient Hospital Stay: Payer: 59 | Attending: Genetic Counselor

## 2018-01-28 ENCOUNTER — Encounter: Payer: Self-pay | Admitting: Internal Medicine

## 2018-01-28 VITALS — BP 104/68 | HR 100 | Temp 98.5°F | Wt 150.3 lb

## 2018-01-28 DIAGNOSIS — Z808 Family history of malignant neoplasm of other organs or systems: Secondary | ICD-10-CM | POA: Diagnosis not present

## 2018-01-28 DIAGNOSIS — B360 Pityriasis versicolor: Secondary | ICD-10-CM | POA: Diagnosis not present

## 2018-01-28 DIAGNOSIS — Z315 Encounter for genetic counseling: Secondary | ICD-10-CM | POA: Diagnosis not present

## 2018-01-28 DIAGNOSIS — Z8042 Family history of malignant neoplasm of prostate: Secondary | ICD-10-CM

## 2018-01-28 DIAGNOSIS — Z803 Family history of malignant neoplasm of breast: Secondary | ICD-10-CM | POA: Diagnosis not present

## 2018-01-28 DIAGNOSIS — M545 Low back pain, unspecified: Secondary | ICD-10-CM

## 2018-01-28 MED ORDER — SELENIUM SULFIDE 2.5 % EX LOTN: TOPICAL_LOTION | CUTANEOUS | 99 refills | Status: DC

## 2018-01-28 NOTE — Progress Notes (Signed)
Chief Complaint  Patient presents with  . Back Pain    Pt c/o flare of herniated disc. Pt completed course of Prednisone given by Dr Sarajane Jews 01/16/18. Pt states that she is not using the Flexeril at this time.   . Skin Problem    Pt c/o red rash/discoloration to skin below breast and in bikini area. Does not itch or hurt. Seems to be spreading. Stays and does not go away. ?fugus    HPI:  Samantha Webster 32 y.o.   sda  Last seen by me in 2015  But saw Dr Sarajane Jews end of Jan for lbp sciatica flare usually responds to prednisone but  Although   Some better but not back to baseline  Sitting is an issue.   Never had Pt at some point .  Advised   lbp   Bilateral now  No weakness  inlegs but  Tingling in feet.   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 No fever   Itching   Past Medical History:  Diagnosis Date  . Family history of breast cancer     Family History  Problem Relation Age of Onset  . Breast cancer Mother 19       deceased early onset.   . Breast cancer Maternal Grandmother 95       CHEK2 pos  . Skin cancer Maternal Grandfather   . Scleroderma Paternal Grandmother   . Breast cancer Other        paternal great aunt dx over 80  . Breast cancer Other        MGM's mother  . Prostate cancer Other        MGMs father    Social History   Socioeconomic History  . Marital status: Single    Spouse name: None  . Number of children: None  . Years of education: None  . Highest education level: None  Social Needs  . Financial resource strain: None  . Food insecurity - worry: None  . Food insecurity - inability: None  . Transportation needs - medical: None  . Transportation needs - non-medical: None  Occupational History  . None  Tobacco Use  . Smoking status: Never Smoker  . Smokeless tobacco: Never Used  Substance and Sexual  Activity  . Alcohol use: Yes    Alcohol/week: 4.2 oz    Types: 7 Glasses of wine per week  . Drug use: No  . Sexual activity: None  Other Topics Concern  . None  Social History Narrative   Married    Works day care infant some toddlers     Outpatient Medications Prior to Visit  Medication Sig Dispense Refill  . cyclobenzaprine (FLEXERIL) 10 MG tablet Take 1 tablet (10 mg total) by mouth 3 (three) times daily as needed for muscle spasms. (Patient not taking: Reported on 01/28/2018) 60 tablet 1  . methylPREDNISolone (MEDROL DOSEPAK) 4 MG TBPK tablet As directed (Patient not taking: Reported on 01/28/2018) 21 tablet 0   No facility-administered medications prior to visit.      EXAM:  BP 104/68 (BP Location: Left Arm, Patient Position: Standing, Cuff Size: Normal)   Pulse 100   Temp 98.5 F (36.9 C) (Oral)   Wt 150 lb 4.8 oz (68.2 kg)   LMP  01/06/2018   BMI 25.80 kg/m   Body mass index is 25.8 kg/m.  GENERAL: vitals reviewed and listed above, alert, oriented, appears well hydrated and in no acute distress HEENT: atraumatic, conjunctiva  clear, no obvious abnormalities on inspection of external nose and ears  NECK: no obvious masses on inspection palpation  CV: HRRR, no clubbing cyanosis or  peripheral edema nl cap refill   l ow back no center spine pain  Points to ls area bilaterally toe heel walking is normal Nl gait otherwise  Skin blotchy tan beige   Round areas  both thighs near inguinal area and  Below bra area . Irregularly  MS: moves all extremities without noticeable focal  abnormality PSYCH: pleasant and cooperative, no obvious depression or anxiety  ASSESSMENT AND PLAN:  Discussed the following assessment and plan:  Recurrent low back pain - part response to  pred advise pt and  fu if persistent  - Plan: Ambulatory referral to Physical Therapy  Tinea versicolor Disc options rx    Recurrent back issues at this time refer for PT    rx for TV with selsun   Management and fu if  persistent or progressive but told could come back  -Patient advised to return or notify health care team  if symptoms worsen ,persist or new concerns arise.  Patient Instructions  This looks  Like tinea  Versicolor fungus infection  That can be treated topically  But can flare up at times.  Will do referral  To PT for the back problem  Try 2 aleve  Twice a day in the interim .    Tinea Versicolor Tinea versicolor is a common fungal infection of the skin. It causes a rash that appears as light or dark patches on the skin. The rash most often occurs on the chest, back, neck, or upper arms. This condition is more common during warm weather. Other than affecting how your skin looks, tinea versicolor usually does not cause other problems. In most cases, the infection goes away in a few weeks with treatment. It may take a few months for the patches on your skin to clear up. What are the causes? Tinea versicolor occurs when a type of fungus that is normally present on the skin starts to overgrow. This fungus is a kind of yeast. The exact cause of the overgrowth is not known. This condition cannot be passed from one person to another (noncontagious). What increases the risk? This condition is more likely to develop when certain factors are present, such as:  Heat and humidity.  Sweating too much.  Hormone changes.  Oily skin.  A weak defense (immune) system.  What are the signs or symptoms? Symptoms of this condition may include:  A rash on your skin that is made up of light or dark patches. The rash may have: ? Patches of tan or pink spots on light skin. ? Patches of white or brown spots on dark skin. ? Patches of skin that do not tan. ? Well-marked edges. ? Scales on the discolored areas.  Mild itching.  How is this diagnosed? A health care provider can usually diagnose this condition by looking at your skin. During the exam, he or she may use ultraviolet  light to help determine the extent of the infection. In some cases, a skin sample may be taken by scraping the rash. This sample will be viewed under a microscope to check for yeast overgrowth. How is this treated? Treatment for this  condition may include:  Dandruff shampoo that is applied to the affected skin during showers or bathing.  Over-the-counter medicated skin cream, lotion, or soaps.  Prescription antifungal medicine in the form of skin cream or pills.  Medicine to help reduce itching.  Follow these instructions at home:  Take medicines only as directed by your health care provider.  Apply dandruff shampoo to the affected area if told to do so by your health care provider. You may be instructed to scrub the affected skin for several minutes each day.  Do not scratch the affected area of skin.  Avoid hot and humid conditions.  Do not use tanning booths.  Try to avoid sweating a lot. Contact a health care provider if:  Your symptoms get worse.  You have a fever.  You have redness, swelling, or pain at the site of your rash.  You have fluid, blood, or pus coming from your rash.  Your rash returns after treatment. This information is not intended to replace advice given to you by your health care provider. Make sure you discuss any questions you have with your health care provider. Document Released: 12/01/2000 Document Revised: 08/06/2016 Document Reviewed: 09/15/2014 Elsevier Interactive Patient Education  2018 [place]. Panosh M.D.

## 2018-01-28 NOTE — Patient Instructions (Addendum)
This looks  Like tinea  Versicolor fungus infection  That can be treated topically  But can flare up at times.  Will do referral  To PT for the back problem  Try 2 aleve  Twice a day in the interim .    Tinea Versicolor Tinea versicolor is a common fungal infection of the skin. It causes a rash that appears as light or dark patches on the skin. The rash most often occurs on the chest, back, neck, or upper arms. This condition is more common during warm weather. Other than affecting how your skin looks, tinea versicolor usually does not cause other problems. In most cases, the infection goes away in a few weeks with treatment. It may take a few months for the patches on your skin to clear up. What are the causes? Tinea versicolor occurs when a type of fungus that is normally present on the skin starts to overgrow. This fungus is a kind of yeast. The exact cause of the overgrowth is not known. This condition cannot be passed from one person to another (noncontagious). What increases the risk? This condition is more likely to develop when certain factors are present, such as:  Heat and humidity.  Sweating too much.  Hormone changes.  Oily skin.  A weak defense (immune) system.  What are the signs or symptoms? Symptoms of this condition may include:  A rash on your skin that is made up of light or dark patches. The rash may have: ? Patches of tan or pink spots on light skin. ? Patches of white or brown spots on dark skin. ? Patches of skin that do not tan. ? Well-marked edges. ? Scales on the discolored areas.  Mild itching.  How is this diagnosed? A health care provider can usually diagnose this condition by looking at your skin. During the exam, he or she may use ultraviolet light to help determine the extent of the infection. In some cases, a skin sample may be taken by scraping the rash. This sample will be viewed under a microscope to check for yeast overgrowth. How is this  treated? Treatment for this condition may include:  Dandruff shampoo that is applied to the affected skin during showers or bathing.  Over-the-counter medicated skin cream, lotion, or soaps.  Prescription antifungal medicine in the form of skin cream or pills.  Medicine to help reduce itching.  Follow these instructions at home:  Take medicines only as directed by your health care provider.  Apply dandruff shampoo to the affected area if told to do so by your health care provider. You may be instructed to scrub the affected skin for several minutes each day.  Do not scratch the affected area of skin.  Avoid hot and humid conditions.  Do not use tanning booths.  Try to avoid sweating a lot. Contact a health care provider if:  Your symptoms get worse.  You have a fever.  You have redness, swelling, or pain at the site of your rash.  You have fluid, blood, or pus coming from your rash.  Your rash returns after treatment. This information is not intended to replace advice given to you by your health care provider. Make sure you discuss any questions you have with your health care provider. Document Released: 12/01/2000 Document Revised: 08/06/2016 Document Reviewed: 09/15/2014 Elsevier Interactive Patient Education  2018 [employer].

## 2018-01-28 NOTE — Progress Notes (Signed)
REFERRING PROVIDER: Burnis Medin, MD [place], [place] [address]  PRIMARY PROVIDER:  Burnis Medin, MD  PRIMARY REASON FOR VISIT:  1. Family history of breast cancer      HISTORY OF PRESENT ILLNESS:   [name], a 32 y.o. female, was seen for a [facility] cancer genetics consultation at the request of Dr. Regis Bill due to a family history of cancer and a known family mutation in [place].  Samantha Webster presents to clinic today to discuss the possibility of a hereditary predisposition to cancer, genetic testing, and to further clarify her future cancer risks, as well as potential cancer risks for family members.  [name] is a 32 y.o. female with no personal history of cancer.  She is at an elevated risk for breast cancer based on her mother's young age of onset of breast cancer and a known family mutation in [place], that was identified in her grandmother.  CANCER HISTORY:   No history exists.     HORMONAL RISK FACTORS:  Menarche was at age 27.  First live birth at age 32.  OCP use for approximately <1 years.  Ovaries intact: yes.  Hysterectomy: no.  Menopausal status: premenopausal.  HRT use: 0 years. Colonoscopy: no; not examined. Mammogram within the last year: yes. Number of breast biopsies: 0. Up to date with pelvic exams:  yes. Any excessive radiation exposure in the past:  no  Past Medical History:  Diagnosis Date  . Family history of breast cancer     No past surgical history on file.  Social History   Socioeconomic History  . Marital status: Single    Spouse name: Not on file  . Number of children: Not on file  . Years of education: Not on file  . Highest education level: Not on file  Social Needs  . Financial resource strain: Not on file  . Food insecurity - worry: Not on file  . Food insecurity - inability: Not on file  . Transportation needs - medical: Not on file  . Transportation needs - non-medical: Not on file  Occupational  History  . Not on file  Tobacco Use  . Smoking status: Never Smoker  . Smokeless tobacco: Never Used  Substance and Sexual Activity  . Alcohol use: Yes    Alcohol/week: 4.2 oz    Types: 7 Glasses of wine per week  . Drug use: No  . Sexual activity: Not on file  Other Topics Concern  . Not on file  Social History Narrative   Married    Works day care infant some toddlers      FAMILY HISTORY:  We obtained a detailed, 4-generation family history.  Significant diagnoses are listed below: Family History  Problem Relation Age of Onset  . Breast cancer Mother 60       deceased early onset.   . Breast cancer Maternal Grandmother 66       CHEK2 pos  . Skin cancer Maternal Grandfather   . Scleroderma Paternal Grandmother   . Breast cancer Other        paternal great aunt dx over 73  . Breast cancer Other        MGM's mother  . Prostate cancer Other        MGMs father    The patient has one daughter who is cancer free.  She has one sister who is cancer free.  Her mother is deceased, but her father is alive and well.  The patient's mother was diagnosed with breast cancer at age 33 and died at 51.  She has one brother who is cancer free.  Both maternal grandparents are living.  Her grandmother developed breast cancer at age 57 and recently underwent genetic testing and found a CHEK2 pathogenic mutation.  Her grandmother has one brother who is cancer free.  The grandmother's mother had breast cancer and her father had prostate cancer.  The patient's father has two sisters who are cancer free.  Both paternal grandparents are deceased.  The grandmother died of complications of scleroderma and the grandfather died of old age.  The grandfather had a sister with breast cancer dx in her 39's-60's.  Patient's maternal ancestors are of [place] and [place] descent, and paternal ancestors are of [place] descent. There is no reported Ashkenazi Jewish ancestry. There is no known  consanguinity.  GENETIC COUNSELING ASSESSMENT: Samantha Webster is a 32 y.o. female with a family history of breast and prostate cancer and a known familial mutation in [place] which is somewhat suggestive of a hereditary breast cancer syndrome and predisposition to cancer. We, therefore, discussed and recommended the following at today's visit.   DISCUSSION: We discussed that about 5-10% of breast cancer is hereditary.  In her family a CHEK2 mutation was identified in her maternal grandmother.  CHEK2 is a moderate risk gene, that is associated with an increased risk for breast cancer anywhere from 23-48%.  CHEK2 mutations increase the risk for other cancers including prostate and colon cancer.  Based on her grandmother having a CHEK2 mutations, we estimate that the patient has around a 25% risk for also having this mutation.  We discussed that since her mother had breast cancer, it is suggestive that she also carried this mutation, but we do not know for sure.  Therefore her risk could be between 25*50% based on the history.  We reviewed the characteristics, features and inheritance patterns of hereditary cancer syndromes. We also discussed genetic testing, including the appropriate family members to test, the process of testing, insurance coverage and turn-around-time for results. We discussed the implications of a negative, positive and/or variant of uncertain significant result. We recommended [name] pursue genetic testing for the CHEK2 [id number] pathogenic mutation.   The patient's grandmother underwent genetic testing at [facility].  Complimentary genetic testing is performed on family members for up to 90 days after the test results have been completed.  [name] genetic testing falls within this 90 day period, and therefore she should not receive a bill for genetic testing from [facility].  PLAN: After considering the risks, benefits, and limitations, [name]  provided informed consent to  pursue genetic testing and the blood sample was sent to [facility] for analysis of the CHEK2 mutation. Results should be available within approximately 2-3 weeks' time, at which point they will be disclosed by telephone to [name], as will any additional recommendations warranted by these results. Ms. Kalina will receive a summary of her genetic counseling visit and a copy of her results once available. This information will also be available in Epic. We encouraged Ms. Polansky to remain in contact with cancer genetics annually so that we can continuously update the family history and inform her of any changes in cancer genetics and testing that may be of benefit for her family. Ms. Berberich questions were answered to her satisfaction today. Our contact information was provided should additional questions or concerns arise.  Lastly, we encouraged Ms. Cuthbertson to remain  in contact with cancer genetics annually so that we can continuously update the family history and inform her of any changes in cancer genetics and testing that may be of benefit for this family.   Ms.  Mailloux questions were answered to her satisfaction today. Our contact information was provided should additional questions or concerns arise. Thank you for the referral and allowing [place] to share in the care of your patient.   Karen P. Florene Glen, [place], [facility] Certified Genetic Counselor [email or web address] phone: [phone]  The patient was seen for a total of 45 minutes in face-to-face genetic counseling.  This patient was discussed with Drs. Magrinat, Lindi Adie and/or Burr Medico who agrees with the above.    _______________________________________________________________________ For Office Staff:  Number of people involved in session: 4 Was an Intern/ student involved with case: no

## 2018-01-31 ENCOUNTER — Encounter: Payer: Self-pay | Admitting: Physical Therapy

## 2018-01-31 ENCOUNTER — Other Ambulatory Visit: Payer: Self-pay

## 2018-01-31 ENCOUNTER — Ambulatory Visit: Payer: 59 | Attending: Internal Medicine | Admitting: Physical Therapy

## 2018-01-31 DIAGNOSIS — M62830 Muscle spasm of back: Secondary | ICD-10-CM | POA: Insufficient documentation

## 2018-01-31 DIAGNOSIS — M545 Low back pain: Secondary | ICD-10-CM | POA: Diagnosis not present

## 2018-01-31 DIAGNOSIS — M6281 Muscle weakness (generalized): Secondary | ICD-10-CM | POA: Diagnosis present

## 2018-01-31 NOTE — Therapy (Signed)
[facility] [address], [place], [place], [address] Phone: [phone]   Fax:  [phone]  Physical Therapy Evaluation  Patient Details  Name: Samantha Webster MRN: [id number] Date of Birth: [birth year] Referring Provider: Burnis Medin, MD   Encounter Date: 01/31/2018  PT End of Session - 01/31/18 1248    Visit Number  1    Number of Visits  8    Date for PT Re-Evaluation  03/28/18    Authorization Type  UHC    PT Start Time  7353    PT Stop Time  1313    PT Time Calculation (min)  42 min    Activity Tolerance  Patient tolerated treatment well    Behavior During Therapy  [facility] for tasks assessed/performed       Past Medical History:  Diagnosis Date  . Family history of breast cancer     History reviewed. No pertinent surgical history.  There were no vitals filed for this visit.   Subjective Assessment - 01/31/18 1231    Subjective  Patient has herniated disc that occurred 17 years ago.  Had recent flare up 2 weeks ago that she felt on the left side, but currently it is more across lower back.      Limitations  Sitting;Lifting;House hold activities    Patient Stated Goals  eliminate pain, improve core strength    Currently in Pain?  Yes    Pain Score  0-No pain currently no    Pain Location  Back    Pain Orientation  Left;Right;Lower    Pain Descriptors / Indicators  Burning;Sharp;Radiating    Pain Radiating Towards  radiates across the back in the evening    Pain Onset  1 to 4 weeks ago    Pain Frequency  Intermittent    Aggravating Factors   towards the end of the day, sitting    Pain Relieving Factors  crossing legs in sitting to shift weight to the side    Effect of Pain on Daily Activities  recreational activities, sitting, household chores    Multiple Pain Sites  No         [facility] PT Assessment - 01/31/18 0001      Assessment   Medical Diagnosis  M54.5 (ICD-10-CM) - Recurrent low back pain     Referring Provider  Burnis Medin, MD    Onset Date/Surgical Date  01/17/18    Prior Therapy  No      Precautions   Precautions  None      Restrictions   Weight Bearing Restrictions  No      Balance Screen   Has the patient fallen in the past 6 months  No      [place] residence    Living Arrangements  Spouse/significant other;Children 1 child c-section in 2007      Prior Function   Level of Independence  Independent    Vocation  Full time employment    Vocation Requirements  up and down, walking      Observation/Other Assessments   Focus on Therapeutic Outcomes (FOTO)   47% limited      Posture/Postural Control   Posture/Postural Control  Postural limitations    Postural Limitations  Anterior pelvic tilt      ROM / Strength   AROM / PROM / Strength  AROM;Strength      AROM   AROM Assessment Site  Lumbar  Lumbar Flexion  WNL    Lumbar Extension  25% limited with pain    Lumbar - Right Side Bend  WNL    Lumbar - Left Side Bend  WNL    Lumbar - Right Rotation  WNL    Lumbar - Left Rotation  WNL      Strength   Strength Assessment Site  Hip    Right/Left Hip  Right;Left    Right Hip Extension  4/5    Right Hip ABduction  5/5    Right Hip ADduction  4/5    Left Hip Extension  4+/5    Left Hip ABduction  4-/5    Left Hip ADduction  4-/5      Palpation   SI assessment   no obliquity noted    Palpation comment  muscle spasms in left lumbar paraspinals and glute      Special Tests    Special Tests  Lumbar    Lumbar Tests  Slump Test;Straight Leg Raise      Slump test   Findings  Positive    Side  Left      Straight Leg Raise   Findings  Positive    Side   Left      Ambulation/Gait   Gait Pattern  Within Functional Limits             Objective measurements completed on examination: See above findings.      [place] Adult PT Treatment/Exercise - 01/31/18 0001      Exercises   Exercises  Other  Exercises    Other Exercises   educated and performed intial HEP; discussed using towel roll or pillow in sitting for improved posture             PT Education - 01/31/18 1312    Education provided  Yes    Education Details  initial HEP    Person(s) Educated  Patient    Methods  Explanation;Demonstration;Handout;Verbal cues;Tactile cues    Comprehension  Verbalized understanding;Returned demonstration       PT Short Term Goals - 01/31/18 1325      PT SHORT TERM GOAL #1   Title  ind with initial HEP    Time  4    Period  Weeks    Status  New    Target Date  02/28/18      PT SHORT TERM GOAL #2   Title  able to sit with good posture for 10 minutes without pain or discomfort    Time  4    Period  Weeks    Status  New    Target Date  02/28/18      PT SHORT TERM GOAL #3   Title  reports 25% reduction of pain overall    Time  4    Period  Weeks    Status  New    Target Date  02/28/18        PT Long Term Goals - 01/31/18 1522      PT LONG TERM GOAL #1   Title  ind with advanced HEP    Time  8    Period  Weeks    Status  New    Target Date  03/28/18      PT LONG TERM GOAL #2   Title  left hip 5/5 abduction and adduction for improved stability during functional movements    Time  8    Period  Weeks    Status  New    Target Date  03/28/18      PT LONG TERM GOAL #3   Title  reports 75% less pain overall    Time  8    Period  Weeks    Status  New    Target Date  03/28/18      PT LONG TERM GOAL #4   Title  FOTO < or = to 23%    Time  8    Period  Weeks    Status  New    Target Date  03/28/18             Plan - 01/31/18 1524    Clinical Impression Statement  Pt presents to clinic with recent flare up of chronic low back pain.  She demonstrates decreased lumbar extension ROM with pain at end range.  Pt is positive slump and SLR test on the left.  Pt has muscle spasms in low back and poor sitting and standing posture.  Pt has core and hip weakness.   Pt will benefit from skilled PT to address impairments for return to full functional activities.    History and Personal Factors relevant to plan of care:  chronic low back pain    Clinical Presentation  Stable    Clinical Presentation due to:  pt is stable    Clinical Decision Making  Low    Rehab Potential  Excellent    PT Frequency  1x / week    PT Duration  8 weeks    PT Treatment/Interventions  ADLs/Self Care Home Management;Biofeedback;Cryotherapy;Electrical Stimulation;Iontophoresis 4mg /ml Dexamethasone;Moist Heat;Traction;Ultrasound;Gait training;Stair training;Functional mobility training;Therapeutic activities;Therapeutic exercise;Balance training;Neuromuscular re-education;Patient/family education;Manual techniques;Scar mobilization;Passive range of motion;Dry needling;Taping    PT Next Visit Plan  review TrA contraction, f/u on HEP and assess any ext/flex bias, lumbar ROM, hip mobility, progress core strength as tolerated    Consulted and Agree with Plan of Care  Patient       Patient will benefit from skilled therapeutic intervention in order to improve the following deficits and impairments:  Pain, Increased muscle spasms, Decreased strength  Visit Diagnosis: Acute low back pain, unspecified back pain laterality, with sciatica presence unspecified  Muscle weakness (generalized)  Muscle spasm of back     Problem List Patient Active Problem List   Diagnosis Date Noted  . Family history of breast cancer   . Family hx-breast malignancy 03/25/2013  . Sciatica of right side 06/21/2012  . Right groin pain 06/21/2012  . Recurrent low back pain 06/21/2012  . NEOPLASM, SKIN, UNCERTAIN BEHAVIOR 11/57/2620  . ACNE, CYSTIC 06/12/2008  . LYMPH NODE-ENLARGED 10/04/2007    Zannie Cove, PT 01/31/2018, 5:38 PM  [facility] [address], [place], [place], [address] Phone: [phone]   Fax:  [phone]  Name:  SUGEY TREVATHAN MRN: [id number] Date of Birth: August 05, 1986

## 2018-01-31 NOTE — Patient Instructions (Addendum)
  SINGLE KNEE TO CHEST STRETCH - [place]  While Lying on your back, hold your knee and gently pull it up towards your chest. Hold 10 sec do 5x each side    DOUBLE KNEE TO CHEST STRETCH - [facility]  While Lying on your back,  hold your knees and gently pull them up towards your chest. Hold 30 sec repeat 3 x     Prone on Elbows - Lay propped up on your elbows for 10 sec and repeat 5x. The pain or numbness should move closer to the lower back. If the pain moves toward the lower back, but seems to stop moving then progress to the second picture.    Transverse Abdominus Activation  Contract your lower abdominals as if you were trying to lift one leg from the table.  Initiate the movement but do no lift foot greater than 1 inch from the table.  Repeat opposite side. Do 20 reps .  [facility] [address], [place], [place] [address] Phone # [phone] Fax [phone]

## 2018-02-05 ENCOUNTER — Encounter: Payer: Self-pay | Admitting: Genetic Counselor

## 2018-02-05 ENCOUNTER — Telehealth: Payer: Self-pay | Admitting: Genetic Counselor

## 2018-02-05 DIAGNOSIS — Z1379 Encounter for other screening for genetic and chromosomal anomalies: Secondary | ICD-10-CM | POA: Insufficient documentation

## 2018-02-05 NOTE — Telephone Encounter (Signed)
LM on VM with good news.  Asked that she CB. 

## 2018-02-07 ENCOUNTER — Ambulatory Visit: Payer: 59 | Admitting: Physical Therapy

## 2018-02-07 ENCOUNTER — Ambulatory Visit: Payer: Self-pay | Admitting: Genetic Counselor

## 2018-02-07 DIAGNOSIS — Z803 Family history of malignant neoplasm of breast: Secondary | ICD-10-CM

## 2018-02-07 DIAGNOSIS — M62830 Muscle spasm of back: Secondary | ICD-10-CM

## 2018-02-07 DIAGNOSIS — M545 Low back pain: Secondary | ICD-10-CM | POA: Diagnosis not present

## 2018-02-07 DIAGNOSIS — Z1379 Encounter for other screening for genetic and chromosomal anomalies: Secondary | ICD-10-CM

## 2018-02-07 DIAGNOSIS — M6281 Muscle weakness (generalized): Secondary | ICD-10-CM

## 2018-02-07 NOTE — Progress Notes (Signed)
HPI: Ms. Micheli was previously seen in the [place] clinic due to a family history of cancer, a known familial pathogenic variant in [place] and concerns regarding a hereditary predisposition to cancer. Please refer to our prior cancer genetics clinic note for more information regarding Ms. Perz's medical, social and family histories, and our assessment and recommendations, at the time. Ms. Zbikowski recent genetic test results were disclosed to her, as were recommendations warranted by these results. These results and recommendations are discussed in more detail below.  CANCER HISTORY:   No history exists.    FAMILY HISTORY:  We obtained a detailed, 4-generation family history.  Significant diagnoses are listed below: Family History  Problem Relation Age of Onset  . Breast cancer Mother 45       deceased early onset.   . Breast cancer Maternal Grandmother 74       CHEK2 pos  . Skin cancer Maternal Grandfather   . Scleroderma Paternal Grandmother   . Breast cancer Other        paternal great aunt dx over 77  . Breast cancer Other        MGM's mother  . Prostate cancer Other        MGMs father    The patient has one daughter who is cancer free.  She has one sister who is cancer free.  Her mother is deceased, but her father is alive and well.  The patient's mother was diagnosed with breast cancer at age 59 and died at 26.  She has one brother who is cancer free.  Both maternal grandparents are living.  Her grandmother developed breast cancer at age 28 and recently underwent genetic testing and found a CHEK2 pathogenic mutation.  Her grandmother has one brother who is cancer free.  The grandmother's mother had breast cancer and her father had prostate cancer.  The patient's father has two sisters who are cancer free.  Both paternal grandparents are deceased.  The grandmother died of complications of scleroderma and the grandfather died of old age.  The grandfather had a  sister with breast cancer dx in her 42's-60's.  Patient's maternal ancestors are of [place] and [place] descent, and paternal ancestors are of [place] descent. There is no reported Ashkenazi Jewish ancestry. There is no known consanguinity.  GENETIC TEST RESULTS: Genetic testing reported out on February 05, 2018 through the familial variant testing program cancer panel found no deleterious mutations. The test report has been scanned into EPIC and is located under the Molecular Pathology section of the Results Review tab.   We discussed with Ms. Park that since the current genetic testing is not perfect, it is possible there may be a gene mutation in one of these genes that current testing cannot detect, but that chance is small. We also discussed, that it is possible that another gene that has not yet been discovered, or that we have not yet tested, is responsible for the cancer diagnoses in the family, and it is, therefore, important to remain in touch with cancer genetics in the future so that we can continue to offer Ms. Iovine the most up to date genetic testing.      CANCER SCREENING RECOMMENDATIONS:  This normal result is reassuring and indicates that Ms. Kilbride does not likely have an increased risk of cancer due to a mutation in one of these genes.  We, therefore, recommended  Ms. Nowotny continue to follow the cancer screening guidelines provided by her primary  healthcare providers.   An individual's cancer risk and medical management are not determined by genetic test results alone. Overall cancer risk assessment incorporates additional factors, including personal medical history, family history, and any available genetic information that may result in a personalized plan for cancer prevention and surveillance.  Based on the Ms. Hartel's personal and family history of cancer, as well as her genetic test results, statistical models (Tyrer Cusik)  and literature data were used to estimate  her risk of developing breast cancer. This estimates her lifetime risk of developing breast cancer to be approximately 20.3%.  The patient's lifetime breast cancer risk is a preliminary estimate based on available information using one of several models endorsed by the [place] (ACS). The ACS recommends consideration of breast MRI screening as an adjunct to mammography for patients at high risk (defined as 20% or greater lifetime risk).    Ms. Erker has been determined to be at high risk for breast cancer.  Therefore, we recommend that annual screening with mammography and breast MRI begin at age 38, or 10 years prior to the age of breast cancer diagnosis in a relative (whichever is earlier).  We discussed that Ms. Wojtas should discuss her individual situation with her referring physician and determine a breast cancer screening plan with which they are both comfortable.  This risk will decrease over time, and therefore this assessment should be repeated periodically.  RECOMMENDATIONS FOR FAMILY MEMBERS: Women in this family might be at some increased risk of developing cancer, over the general population risk, simply due to the family history of cancer. We recommended women in this family have a yearly mammogram beginning at age 64, or 56 years younger than the earliest onset of cancer, an annual clinical breast exam, and perform monthly breast self-exams. Women in this family should also have a gynecological exam as recommended by their primary provider. All family members should have a colonoscopy by age 20.  FOLLOW-UP: Lastly, we discussed with Ms. Ashline that cancer genetics is a rapidly advancing field and it is possible that new genetic tests will be appropriate for her and/or her family members in the future. We encouraged her to remain in contact with cancer genetics on an annual basis so we can update her personal and family histories and let her know of advances in cancer genetics  that may benefit this family.   Our contact number was provided. Ms. Sneeringer questions were answered to her satisfaction, and she knows she is welcome to call us at anytime with additional questions or concerns.   Roma Kayser, MS, [facility] Certified Genetic Counselor [email or web address]

## 2018-02-07 NOTE — Therapy (Signed)
[facility] [address], [place], [place], [address] Phone: [phone]   Fax:  [phone]  Physical Therapy Treatment  Patient Details  Name: Samantha Webster MRN: [id number] Date of Birth: [birth year] Referring Provider: Burnis Medin, MD   Encounter Date: 02/07/2018  PT End of Session - 02/07/18 0852    Visit Number  2    Number of Visits  8    Date for PT Re-Evaluation  03/28/18    Authorization Type  UHC    PT Start Time  0845    PT Stop Time  0930    PT Time Calculation (min)  45 min    Activity Tolerance  Patient tolerated treatment well    Behavior During Therapy  [facility] for tasks assessed/performed       Past Medical History:  Diagnosis Date  . Family history of breast cancer     No past surgical history on file.  There were no vitals filed for this visit.  Subjective Assessment - 02/07/18 0856    Subjective  Patient states that sitting is still aggravating and has to sit on one side or the other.  She reports the double knee to chest stretch feels the best.    Limitations  Sitting;Lifting;House hold activities    Patient Stated Goals  eliminate pain, improve core strength    Currently in Pain?  No/denies                      [facility] Adult PT Treatment/Exercise - 02/07/18 0001      Self-Care   Self-Care  Posture    Posture  seated posture with towel roll      Lumbar Exercises: Stretches   Single Knee to Chest Stretch  3 reps;10 seconds    Double Knee to Chest Stretch  3 reps;30 seconds    Press Ups  10 reps;20 seconds    Figure 4 Stretch  3 reps;10 seconds      Lumbar Exercises: Supine   Bent Knee Raise  20 reps    Bridge  20 reps      hip abduction - red band - 20x Hip ext prone - 10x each side        PT Education - 02/07/18 0928    Education provided  Yes    Education Details  abduction red band, hip ext    Person(s) Educated  Patient    Methods   Explanation;Demonstration;Tactile cues;Verbal cues;Handout    Comprehension  Verbalized understanding;Returned demonstration       PT Short Term Goals - 02/07/18 0853      PT SHORT TERM GOAL #1   Title  ind with initial HEP    Time  4    Period  Weeks    Status  On-going      PT SHORT TERM GOAL #2   Title  able to sit with good posture for 10 minutes without pain or discomfort    Time  4    Period  Weeks    Status  On-going      PT SHORT TERM GOAL #3   Title  reports 25% reduction of pain overall    Time  4    Period  Weeks    Status  On-going        PT Long Term Goals - 01/31/18 1522      PT LONG TERM GOAL #1   Title  ind with  advanced HEP    Time  8    Period  Weeks    Status  New    Target Date  03/28/18      PT LONG TERM GOAL #2   Title  left hip 5/5 abduction and adduction for improved stability during functional movements    Time  8    Period  Weeks    Status  New    Target Date  03/28/18      PT LONG TERM GOAL #3   Title  reports 75% less pain overall    Time  8    Period  Weeks    Status  New    Target Date  03/28/18      PT LONG TERM GOAL #4   Title  FOTO < or = to 23%    Time  8    Period  Weeks    Status  New    Target Date  03/28/18            Plan - 02/07/18 0853    Clinical Impression Statement  Patient responded well to lumbar prone press up.  She was able to demonstrate good sitting posture with weight even distributed with verbal cues.  She understands and will impliment use of towel roll at work.  Pt demonstrates some compensation of using lumbar erectors and needs verbal and tactile cues.  Pt will benefit rom skilled PT to continue working on core strength and stability.    PT Treatment/Interventions  ADLs/Self Care Home Management;Biofeedback;Cryotherapy;Electrical Stimulation;Iontophoresis 4mg /ml Dexamethasone;Moist Heat;Traction;Ultrasound;Gait training;Stair training;Functional mobility training;Therapeutic  activities;Therapeutic exercise;Balance training;Neuromuscular re-education;Patient/family education;Manual techniques;Scar mobilization;Passive range of motion;Dry needling;Taping    PT Next Visit Plan  lumbar extension progression, core stabilization progression and TrA activation during functional movements, LE and hip stretches    Consulted and Agree with Plan of Care  Patient       Patient will benefit from skilled therapeutic intervention in order to improve the following deficits and impairments:  Pain, Increased muscle spasms, Decreased strength  Visit Diagnosis: Acute low back pain, unspecified back pain laterality, with sciatica presence unspecified  Muscle weakness (generalized)  Muscle spasm of back     Problem List Patient Active Problem List   Diagnosis Date Noted  . Genetic testing 02/05/2018  . Family history of breast cancer   . Family hx-breast malignancy 03/25/2013  . Sciatica of right side 06/21/2012  . Right groin pain 06/21/2012  . Recurrent low back pain 06/21/2012  . NEOPLASM, SKIN, UNCERTAIN BEHAVIOR 16/09/9603  . ACNE, CYSTIC 06/12/2008  . LYMPH NODE-ENLARGED 10/04/2007    Zannie Cove, PT 02/07/2018, 9:32 AM   [facility] [address], [place], [place], [address] Phone: [phone]   Fax:  [phone]  Name: PIERRETTE SCHEU MRN: [id number] Date of Birth: 1986/11/15

## 2018-02-07 NOTE — Patient Instructions (Signed)
   SUPINE HIP ABDUCTION - ELASTIC BAND CLAMS - [place]  Lie down on your back with your knees bent. Place an elastic band around your knees and then draw your knees apart.  Do one leg at a time bracing with abdominals - 2 sets of 10 each side    PRONE HIP EXTENSION  While lying face down with your knee straight, slowly raise up leg off the ground. Maintain abdominal braced and straight knee the entire time.  2 sets of 10 on each side  [facility] [address], [place], [place] [address] Phone # [phone] Fax [phone]

## 2018-02-14 ENCOUNTER — Ambulatory Visit: Payer: 59 | Admitting: Physical Therapy

## 2018-02-14 DIAGNOSIS — M545 Low back pain: Secondary | ICD-10-CM

## 2018-02-14 DIAGNOSIS — M6281 Muscle weakness (generalized): Secondary | ICD-10-CM

## 2018-02-14 DIAGNOSIS — M62830 Muscle spasm of back: Secondary | ICD-10-CM

## 2018-02-14 NOTE — Patient Instructions (Signed)
  HIP ABDUCTION - SIDELYING  While lying on your side, slowly raise up your top leg to the side. Keep your knee straight and maintain your toes pointed forward the entire time. Keep your leg in-line with your body. The bottom leg can be bent to stabilize your body. Do 2 sets of [place]  While lying face down with your knee straight, slowly raise up leg off the ground. Keep pressure on the front of pelvis even on both sides and brace core.  Maintain a straight knee the entire time. Do 2 sets of 10      HIP ADDUCTION - SIDELYING  While lying on your side, slowly raise up your bottom leg towards the ceiling. Keep your knee straight the entire time.  Your top leg should be bent at the knee and your foot planted on the ground supporting your body.  Do 2 sets of 10     QUADRUPED ALTERNATE ARM AND LEG - BIRD DOG  While in a crawling position, brace at your abdominals and then slowly lift a leg and opposite arm upwards.  Maintain a level and stable pelvis and spine the entire time.  Hold 5 seconds, repeat 10x each side  [facility] [address] [place], [place] [address] Phone # [phone] Fax [phone]

## 2018-02-14 NOTE — Therapy (Signed)
[facility] [address], [place], [place], [address] Phone: [phone]   Fax:  [phone]  Physical Therapy Treatment  Patient Details  Name: Samantha Webster MRN: [id number] Date of Birth: 24-Apr-1986 Referring Provider: Burnis Medin, MD   Encounter Date: 02/14/2018  PT End of Session - 02/14/18 0847    Visit Number  3    Number of Visits  8    Date for PT Re-Evaluation  03/28/18    Authorization Type  UHC    PT Start Time  0844    PT Stop Time  0924    PT Time Calculation (min)  40 min    Activity Tolerance  Patient tolerated treatment well    Behavior During Therapy  [facility] for tasks assessed/performed       Past Medical History:  Diagnosis Date  . Family history of breast cancer     No past surgical history on file.  There were no vitals filed for this visit.  Subjective Assessment - 02/14/18 0846    Subjective  Patient reports things are getting better and better each day.  I feel 90% better    Limitations  Sitting;Lifting;House hold activities    Patient Stated Goals  eliminate pain, improve core strength    Currently in Pain?  No/denies                      [facility] Adult PT Treatment/Exercise - 02/14/18 0001      Neuro Re-ed    Neuro Re-ed Details   TrA activation and pelvic alignment with exercises; on foam roll      Lumbar Exercises: Stretches   Press Ups  10 seconds;10 reps      Lumbar Exercises: Aerobic   Nustep  L3 x 7 min PT present to discuss progress      Lumbar Exercises: Supine   Bent Knee Raise  20 reps lying on foam roll    Dead Bug  15 reps on foam roll; more challenging when stabilizing with left LE    Large Ball Abdominal Isometric  Other (comment);15 reps holding red pball overhead raise on foam roll    Other Supine Lumbar Exercises  hip ER in hooklying on foam roll       Lumbar Exercises: Sidelying   Hip Abduction  Right;Left;15 reps    Other Sidelying  Lumbar Exercises  hip adduction 15x each side      Lumbar Exercises: Prone   Straight Leg Raise  20 reps cues to stabilize pelvis             PT Education - 02/14/18 0924    Education provided  Yes    Education Details  hip ext, abduction, adduction, quadruped UE/LE    Person(s) Educated  Patient    Methods  Explanation;Demonstration;Handout;Verbal cues;Tactile cues    Comprehension  Returned demonstration;Verbalized understanding       PT Short Term Goals - 02/14/18 0847      PT SHORT TERM GOAL #1   Title  ind with initial HEP    Time  4    Period  Weeks    Status  Achieved      PT SHORT TERM GOAL #2   Title  able to sit with good posture for 10 minutes without pain or discomfort    Time  4    Period  Weeks    Status  Achieved      PT  SHORT TERM GOAL #3   Title  reports 25% reduction of pain overall    Baseline  90%    Time  4    Period  Weeks    Status  Achieved        PT Long Term Goals - 02/14/18 0850      PT LONG TERM GOAL #1   Title  ind with advanced HEP    Time  8    Period  Weeks    Status  On-going      PT LONG TERM GOAL #2   Title  left hip 5/5 abduction and adduction for improved stability during functional movements    Time  8    Period  Weeks    Status  On-going      PT LONG TERM GOAL #3   Title  reports 75% less pain overall    Baseline  90%    Time  8    Period  Weeks    Status  Achieved      PT LONG TERM GOAL #4   Title  FOTO < or = to 23%    Time  8    Period  Weeks    Status  On-going            Plan - 02/14/18 4970    Clinical Impression Statement  Pt was able to progress core strengthening.  She is having 90% less pain and has met short term goals.  Pt continues to have core and hip weakness and instability needing cues throughout session for good alignment during treatment.  Pt will benefit from skilled PT to work on strength for improved stability during functional movements.    PT Treatment/Interventions   ADLs/Self Care Home Management;Biofeedback;Cryotherapy;Electrical Stimulation;Iontophoresis 72m/ml Dexamethasone;Moist Heat;Traction;Ultrasound;Gait training;Stair training;Functional mobility training;Therapeutic activities;Therapeutic exercise;Balance training;Neuromuscular re-education;Patient/family education;Manual techniques;Scar mobilization;Passive range of motion;Dry needling;Taping    PT Next Visit Plan  core stabilization progression and TrA activation during functional movements, fire hydrants in quadruped, standing and squat progression for core and hip strength    Consulted and Agree with Plan of Care  Patient       Patient will benefit from skilled therapeutic intervention in order to improve the following deficits and impairments:  Pain, Increased muscle spasms, Decreased strength  Visit Diagnosis: Acute low back pain, unspecified back pain laterality, with sciatica presence unspecified  Muscle weakness (generalized)  Muscle spasm of back     Problem List Patient Active Problem List   Diagnosis Date Noted  . Genetic testing 02/05/2018  . Family history of breast cancer   . Family hx-breast malignancy 03/25/2013  . Sciatica of right side 06/21/2012  . Right groin pain 06/21/2012  . Recurrent low back pain 06/21/2012  . NEOPLASM, SKIN, UNCERTAIN BEHAVIOR 026/37/8588 . ACNE, CYSTIC 06/12/2008  . LYMPH NODE-ENLARGED 10/04/2007    JZannie Cove PT 02/14/2018, 9:32 AM  [facility] [address] [place] [address]Phone: [phone]  Fax:  [phone] Name: Samantha EHMANNMRN: [id number] of Birth: [birth year]

## 2018-02-21 ENCOUNTER — Encounter: Payer: 59 | Admitting: Physical Therapy

## 2018-02-28 ENCOUNTER — Ambulatory Visit: Payer: 59 | Attending: Internal Medicine | Admitting: Physical Therapy

## 2018-02-28 ENCOUNTER — Encounter: Payer: Self-pay | Admitting: Physical Therapy

## 2018-02-28 DIAGNOSIS — M62830 Muscle spasm of back: Secondary | ICD-10-CM | POA: Diagnosis present

## 2018-02-28 DIAGNOSIS — M6281 Muscle weakness (generalized): Secondary | ICD-10-CM | POA: Diagnosis present

## 2018-02-28 DIAGNOSIS — M545 Low back pain: Secondary | ICD-10-CM

## 2018-02-28 NOTE — Therapy (Signed)
[facility] [address] [place], [place], [address] Phone: [phone]   Fax:  [phone]  Physical Therapy Treatment  Patient Details  Name: Samantha Webster MRN: [id number] Date of Birth: 1986/09/29 Referring Provider: Burnis Medin, MD   Encounter Date: 02/28/2018  PT End of Session - 02/28/18 0859    Visit Number  4    Date for PT Re-Evaluation  03/28/18    Authorization Type  UHC    PT Start Time  [phone]    PT Stop Time  0930    PT Time Calculation (min)  38 min    Activity Tolerance  Patient tolerated treatment well    Behavior During Therapy  [facility] for tasks assessed/performed       Past Medical History:  Diagnosis Date  . Family history of breast cancer     History reviewed. No pertinent surgical history.  There were no vitals filed for this visit.  Subjective Assessment - 02/28/18 0855    Subjective  I feel like I am back to doing pretty much everything normally.    Limitations  Sitting;Lifting;House hold activities    Currently in Pain?  No/denies                      [facility] Adult PT Treatment/Exercise - 02/28/18 0001      Exercises   Other Exercises   reviewed and performed exercises as seen in HEP - educated on new exercises      Lumbar Exercises: Aerobic   Nustep  L3 x 8 min PT present to discuss progress             PT Education - 02/28/18 0929    Education provided  Yes    Education Details  HEP as seen in chart    Person(s) Educated  Patient    Methods  Explanation;Demonstration;Handout;Verbal cues;Tactile cues    Comprehension  Verbalized understanding;Returned demonstration       PT Short Term Goals - 02/14/18 0847      PT SHORT TERM GOAL #1   Title  ind with initial HEP    Time  4    Period  Weeks    Status  Achieved      PT SHORT TERM GOAL #2   Title  able to sit with good posture for 10 minutes without pain or discomfort    Time  4    Period   Weeks    Status  Achieved      PT SHORT TERM GOAL #3   Title  reports 25% reduction of pain overall    Baseline  90%    Time  4    Period  Weeks    Status  Achieved        PT Long Term Goals - 02/28/18 0906      PT LONG TERM GOAL #1   Title  ind with advanced HEP    Time  8    Period  Weeks    Status  Achieved      PT LONG TERM GOAL #2   Title  left hip 5/5 abduction and adduction for improved stability during functional movements    Time  8    Period  Weeks    Status  Achieved      PT LONG TERM GOAL #3   Title  reports 75% less pain overall    Baseline  90%    Time  8    Period  Weeks    Status  Achieved      PT LONG TERM GOAL #4   Title  FOTO < or = to 23%    Baseline  30% improved from 47%    Time  8    Period  Weeks    Status  Partially Met            Plan - 02/28/18 0931    Clinical Impression Statement  Pt is doing well and has been able to manage her pain.  She reviewed exercises and was able to perform advanced HEP correctly so she can continue strengthening at home.  Pt will discharge today with HEP    PT Treatment/Interventions  ADLs/Self Care Home Management;Biofeedback;Cryotherapy;Electrical Stimulation;Iontophoresis 64m/ml Dexamethasone;Moist Heat;Traction;Ultrasound;Gait training;Stair training;Functional mobility training;Therapeutic activities;Therapeutic exercise;Balance training;Neuromuscular re-education;Patient/family education;Manual techniques;Scar mobilization;Passive range of motion;Dry needling;Taping    PT Next Visit Plan  discharge today    Consulted and Agree with Plan of Care  Patient       Patient will benefit from skilled therapeutic intervention in order to improve the following deficits and impairments:  Decreased strength  Visit Diagnosis: Acute low back pain, unspecified back pain laterality, with sciatica presence unspecified  Muscle weakness (generalized)  Muscle spasm of back     Problem List Patient Active  Problem List   Diagnosis Date Noted  . Genetic testing 02/05/2018  . Family history of breast cancer   . Family hx-breast malignancy 03/25/2013  . Sciatica of right side 06/21/2012  . Right groin pain 06/21/2012  . Recurrent low back pain 06/21/2012  . NEOPLASM, SKIN, UNCERTAIN BEHAVIOR 019/94/1290 . ACNE, CYSTIC 06/12/2008  . LYMPH NODE-ENLARGED 10/04/2007    JZannie Cove PT 02/28/2018, 9:34 AM  [facility] [address] [place] [address]Phone: [phone]  Fax:  [phone] Name: Samantha SACCENTEMRN: [id number] of Birth: [birth year] PHYSICAL THERAPY DISCHARGE SUMMARY  Visits from Start of Care: 4  Current functional level related to goals / functional outcomes: See above details   Remaining deficits: See above details   Education / Equipment: HEP  Plan: Patient agrees to discharge.  Patient goals were met. Patient is being discharged due to meeting the stated rehab goals.  ?????    JGoogle PT 02/28/18 9:36 AM

## 2018-02-28 NOTE — Patient Instructions (Signed)
   FIRE HYDRANT - QUADRUPED HIP ABDUCTION  Start on your hands and knees in a crawl position as shown.   Next, raise your leg out to the side while maintaining a stable spine. 2 x 10    Single Leg Deadlift  Standing on left leg, bring right leg to 90 degrees in front of you.  Next, extend right leg behind you until you reach a lunge position keeping weight on left leg.  Keep left leg in straight forward alignment.  Repeat 2 sets on both legs, 10 reps    Lunges  Take large step forward with one foot and bend at both knees as if driving back knee down toward the floor. Be aware not to let front knee go past toes. Return and repeat with other leg. 10x each side.  Add hand weights when you can do 20 reps on each leg    BENT OVER ROW  Hold one end of the elastic band in each hand. Lay the band across the floor and step on it with feet shoulder width apart. Hinge at the hips to maintain a quarter squat.   Pull your hands towards your hips, squeezing the shoulder blades together. Do not let your shoulder rise up towards your elbows. Slowly return to starting position and repeat.     Side Steps: Band around Feet  Place a band around your feet.  With your knees slightly bent and toes pointed forward, step sideways to approximately 25 feet and return to the starting position facing the same direction.  **IMPORTANT** Keep tension in the band the entire time by taking a big step with leading foot, then, a smaller step with the trailing foot. You can stand on band and hold it. Remember to shift your weight back on the heels of your feet but do not let your toes come off of the ground.  20 steps each way  [facility] [address] [place], [place] [address] Phone # [phone] Fax [phone]

## 2018-03-06 ENCOUNTER — Encounter: Payer: 59 | Admitting: Physical Therapy

## 2018-03-14 ENCOUNTER — Encounter: Payer: 59 | Admitting: Physical Therapy

## 2018-03-21 ENCOUNTER — Encounter: Payer: 59 | Admitting: Physical Therapy

## 2018-03-28 ENCOUNTER — Encounter: Payer: 59 | Admitting: Physical Therapy

## 2018-09-30 ENCOUNTER — Telehealth: Payer: Self-pay | Admitting: Genetic Counselor

## 2018-09-30 ENCOUNTER — Other Ambulatory Visit: Payer: Self-pay | Admitting: Obstetrics and Gynecology

## 2018-09-30 DIAGNOSIS — Z803 Family history of malignant neoplasm of breast: Secondary | ICD-10-CM

## 2018-09-30 NOTE — Telephone Encounter (Signed)
Dr. Sherran Needs office called asking if patient should have more testing than for the CHEK2 mutation in the family.  I mentioned that the patient had the free testing for CHEK2 based on her grandmother's testing.  It is presumed that her mother also had the CHEK2 mutation, but unknown since she is now deceased.  The patient did not mention a family history of anything other than melanoma on the maternal grandfather's side of the family, so there is a low chance (but not 0) that there is a mutation coming from that side of the family.  Similarly, her father does not have a family history that would be consistent with a hereditary cancer syndrome.    That said, about 6% of paitents will have two mutations in the family.  If Dr. Radene Knee is uneasy about not testing for more genes, we can certainly offer additional testing.  We discussed that it is probable, but unlikely, that she would test positive for something else.

## 2019-04-21 ENCOUNTER — Ambulatory Visit (INDEPENDENT_AMBULATORY_CARE_PROVIDER_SITE_OTHER): Payer: 59 | Admitting: Internal Medicine

## 2019-04-21 ENCOUNTER — Encounter: Payer: Self-pay | Admitting: Internal Medicine

## 2019-04-21 ENCOUNTER — Other Ambulatory Visit: Payer: Self-pay

## 2019-04-21 DIAGNOSIS — Z79899 Other long term (current) drug therapy: Secondary | ICD-10-CM | POA: Diagnosis not present

## 2019-04-21 DIAGNOSIS — G4700 Insomnia, unspecified: Secondary | ICD-10-CM

## 2019-04-21 MED ORDER — ZOLPIDEM TARTRATE 5 MG PO TABS: 5.0000 mg | ORAL_TABLET | Freq: Every evening | ORAL | 0 refills | Status: DC | PRN

## 2019-04-21 NOTE — Progress Notes (Signed)
Virtual Visit via Video Note  I connected with@ on 04/21/19 at 10:00 AM EDT by a video enabled telemedicine application and verified that I am speaking with the correct person using two identifiers. Location patient: home Location provider:work office Persons participating in the virtual visit: patient, provider  [place] national recommendations  regarding COVID 19 pandemic   video visit is advised over in office visit for this patient.   the limitations of evaluation and management by telemedicine and  availability of in person appointments. The patient expressed understanding and agreed to proceed.   HPI: Samantha Webster presents for vv for flare up of  sleep issues  In past used alprazolam  Before bed and helped  Cause mind races before sleep . Using all sleep hygiene  No depression  No panic attacks and feels pretty good all over otherwise .   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  Past Medical History:  Diagnosis Date  . Family history of breast cancer     History reviewed. No pertinent surgical history.  Family History  Problem Relation Age of Onset  . Breast cancer Mother 66       deceased early onset.   . Breast cancer Maternal Grandmother 58       CHEK2 pos  . Skin cancer Maternal Grandfather   . Scleroderma Paternal Grandmother   . Breast cancer Other        paternal great aunt dx over 4  . Breast cancer Other        MGM's mother  . Prostate cancer Other        MGMs father    Social History   Tobacco Use  . Smoking status: Never Smoker  . Smokeless tobacco: Never Used  Substance Use Topics  . Alcohol use: Yes    Alcohol/week: 7.0 standard drinks    Types: 7 Glasses of wine per week  . Drug use: No      Current Outpatient Medications:  .  cyclobenzaprine (FLEXERIL) 10 MG tablet, Take 1 tablet (10 mg total) by mouth 3 (three) times daily as needed for muscle spasms.  (Patient not taking: Reported on 01/28/2018), Disp: 60 tablet, Rfl: 1 .  zolpidem (AMBIEN) 5 MG tablet, Take 1 tablet (5 mg total) by mouth at bedtime as needed for sleep. limit to 3-4 x per week., Disp: 20 tablet, Rfl: 0  EXAM: BP Readings from Last 3 Encounters:  01/28/18 104/68  01/16/18 104/72  06/04/17 110/76    VITALS per patient if applicable: looks well    GENERAL: alert, oriented, appears well and in no acute distress  HEENT: atraumatic, conjunttiva clear, no obvious abnormalities on inspection of external nose and ears  NECK: normal movements of the head and neck  LUNGS: on inspection no signs of respiratory distress, breathing rate appears normal, no obvious gross SOB, gasping or wheezing  CV: no obvious cyanosis  MS: moves all visible extremities without noticeable abnormality  PSYCH/NEURO: pleasant and cooperative, no obvious depression or anxiety, speech and thought processing grossly intact No results found for: WBC, HGB, HCT, PLT, GLUCOSE, CHOL, TRIG, HDL, LDLDIRECT, LDLCALC, ALT, AST, NA, K, CL, CREATININE, BUN, CO2, TSH, PSA, INR, GLUF, HGBA1C, MICROALBUR  ASSESSMENT AND PLAN:  Discussed the following assessment and plan:  Insomnia, unspecified type  Medication management Insomnia aggravation seems to b e aggravated by  Work loss change  schedule and  "mind racing" as in past  But no spec depression   Has underlying some anxiety  Counseled.  To continue   Sleep hygienes and add  Worry time tec  And continue routine getting out etc .    Risk benefit of medication discussed.   rx ambien 20 for now and can take an extra   Let us know how and if helping  And then we can refill and make plan .   Expectant management and discussion of plan and treatment with patient with opportunity to ask questions and all were answered. The patient agreed with the plan and demonstrated an understanding of the instructions.   The patient was advised to call back or seek an in-person  evaluation if worsening  or having concerns . In interim    Shanon Ace, MD

## 2019-06-24 ENCOUNTER — Telehealth: Payer: Self-pay | Admitting: Internal Medicine

## 2019-06-24 MED ORDER — ZOLPIDEM TARTRATE 5 MG PO TABS: 5.0000 mg | ORAL_TABLET | Freq: Every evening | ORAL | 0 refills | Status: DC | PRN

## 2019-06-24 NOTE — Telephone Encounter (Signed)
Sent in electronically .  

## 2019-06-24 NOTE — Telephone Encounter (Signed)
Medication Refill - Medication: zolpidem (AMBIEN) 5 MG tablet  Has the patient contacted their pharmacy? Yes.   (Agent: If no, request that the patient contact the pharmacy for the refill.) (Agent: If yes, when and what did the pharmacy advise?)  Preferred Pharmacy (with phone number or street name):  [id number] - [place], [place] - [address] [phone] (Phone) [phone] (Fax)     Agent: Please be advised that RX refills may take up to 3 business days. We ask that you follow-up with your pharmacy.

## 2019-07-16 NOTE — Progress Notes (Signed)
Virtual Visit via Video Note  I connected with@ on 07/17/19 at  9:00 AM EDT by a video enabled telemedicine application and verified that I am speaking with the correct person using two identifiers. Location patient: home Location provider: home office Persons participating in the virtual visit: patient, provider  [employer] national recommendations  regarding COVID 19 pandemic   video visit is advised over in office visit for this patient.  Patient aware  of the limitations of evaluation and management by telemedicine and  availability of in person appointments. and agreed to proceed.   HPI: Samantha Webster presents for video visit for  Heart burn type sx.  Onset about April of burnind in lower throat and   Like a furnace in Upper stomach   She tried her dads  omeprezole 40 3 x per week and helped rather quick ely    And since then  But ran out of  Med  Over the past weeks and it is comeing back .   No weight loss dysphagia  And no nocturnal sx  Seems to be worse after eeating or drinking    . Soda water   Some caffiene and wine.    tums and other not helpful  hasn t tried pepcid.  At this time.   One episoe of vomiting  Once not sure related  Taking ibuprofen 600 off and on once a day as needed for her sciatica   Ms pains with help . Has been doing this for a while.    ROSSee pertinent positives and negatives per HPI.  Past Medical History:  Diagnosis Date  . Family history of breast cancer     History reviewed. No pertinent surgical history.  Family History  Problem Relation Age of Onset  . Breast cancer Mother 3       deceased early onset.   . Breast cancer Maternal Grandmother 27       CHEK2 pos  . Skin cancer Maternal Grandfather   . Scleroderma Paternal Grandmother   . Breast cancer Other        paternal great aunt dx over 37  . Breast cancer Other        MGM's mother  . Prostate cancer Other        MGMs father    Social History   Tobacco Use  . Smoking status:  Never Smoker  . Smokeless tobacco: Never Used  Substance Use Topics  . Alcohol use: Yes    Alcohol/week: 7.0 standard drinks    Types: 7 Glasses of wine per week  . Drug use: No      Current Outpatient Medications:  .  cyclobenzaprine (FLEXERIL) 10 MG tablet, Take 1 tablet (10 mg total) by mouth 3 (three) times daily as needed for muscle spasms. (Patient not taking: Reported on 01/28/2018), Disp: 60 tablet, Rfl: 1 .  omeprazole (PRILOSEC) 40 MG capsule, Take 1 capsule (40 mg total) by mouth daily., Disp: 30 capsule, Rfl: 1 .  zolpidem (AMBIEN) 5 MG tablet, Take 1 tablet (5 mg total) by mouth at bedtime as needed for sleep. limit to 3-4 x per week., Disp: 20 tablet, Rfl: 0  EXAM: BP Readings from Last 3 Encounters:  01/28/18 104/68  01/16/18 104/72  06/04/17 110/76    VITALS per patient if applicable: GENERAL: alert, oriented, appears well and in no acute distress  HEENT: atraumatic, conjunttiva clear, no obvious abnormalities on inspection of external nose and ears  NECK: normal movements of the  head and neck  LUNGS: on inspection no signs of respiratory distress, breathing rate appears normal, no obvious gross SOB, gasping or wheezing  CV: no obvious cyanosis  MS: moves all visible extremities without noticeable abnormality  PSYCH/NEURO: pleasant and cooperative, no obvious depression or anxiety, speech and thought processing grossly intact No results found for: WBC, HGB, HCT, PLT, GLUCOSE, CHOL, TRIG, HDL, LDLDIRECT, LDLCALC, ALT, AST, NA, K, CL, CREATININE, BUN, CO2, TSH, PSA, INR, GLUF, HGBA1C, MICROALBUR  ASSESSMENT AND PLAN:  Discussed the following assessment and plan:    ICD-10-CM   1. Pyrosis  R12    vv rov in 1 month    2. NSAID long-term use  Z79.1   3. Heartburn  R12   4. Medication management  Z79.899    Poss nsaid aggravated  With gastritis no alarm sx x ongoing  But seems to respond    Warned about rebpound and will do a ppi trial and then plan wean  transition    ROV VV in a month  And go from there .  May do a  ppi  On days of taking nsaid   Or try to transition to pepcid  When calmed down   . To fu if  Alarm sx in interim  Counseled.   Expectant management and discussion of plan and treatment with opportunity to ask questions and all were answered. The patient agreed with the plan and demonstrated an understanding of the instructions.   Advised to call back or seek an in-person evaluation if worsening  or having  further concerns .    Shanon Ace, MD

## 2019-07-17 ENCOUNTER — Encounter: Payer: Self-pay | Admitting: Internal Medicine

## 2019-07-17 ENCOUNTER — Telehealth (INDEPENDENT_AMBULATORY_CARE_PROVIDER_SITE_OTHER): Payer: 59 | Admitting: Internal Medicine

## 2019-07-17 ENCOUNTER — Other Ambulatory Visit: Payer: Self-pay

## 2019-07-17 DIAGNOSIS — R12 Heartburn: Secondary | ICD-10-CM

## 2019-07-17 DIAGNOSIS — Z791 Long term (current) use of non-steroidal anti-inflammatories (NSAID): Secondary | ICD-10-CM

## 2019-07-17 DIAGNOSIS — Z79899 Other long term (current) drug therapy: Secondary | ICD-10-CM | POA: Diagnosis not present

## 2019-07-17 MED ORDER — OMEPRAZOLE 40 MG PO CPDR: 40.0000 mg | DELAYED_RELEASE_CAPSULE | Freq: Every day | ORAL | 1 refills | Status: DC

## 2019-07-22 ENCOUNTER — Other Ambulatory Visit: Payer: Self-pay | Admitting: Internal Medicine

## 2019-07-22 NOTE — Telephone Encounter (Signed)
lvm for pharmacy to call back to see why it was only 4 pills

## 2019-07-22 NOTE — Telephone Encounter (Signed)
Medication Refill - Medication: zolpidem (AMBIEN) 5 MG tablet    Has the patient contacted their pharmacy? Yes.  Pt called stating she went to go pick up refill of medication and they only gave her 4 pills. Please advise.  (Agent: If no, request that the patient contact the pharmacy for the refill.) (Agent: If yes, when and what did the pharmacy advise?)  Preferred Pharmacy (with phone number or street name):  [id number] [place], [place]  [address] [place] [address]  Phone: [phone] Fax: [phone]  Not a 24 hour pharmacy; exact hours not known.     Agent: Please be advised that RX refills may take up to 3 business days. We ask that you follow-up with your pharmacy.

## 2019-07-23 NOTE — Telephone Encounter (Signed)
Tried to call patient to explain why they only gave 4 pills sent doctor panosh request to refill medication

## 2019-07-23 NOTE — Telephone Encounter (Signed)
Last filled:06/24/19 Last ov:04/21/2019

## 2019-07-23 NOTE — Telephone Encounter (Signed)
Called pharmacy they said she received 16 pills before then picked up the other 4 pills on another date

## 2019-07-24 MED ORDER — ZOLPIDEM TARTRATE 5 MG PO TABS: 5.0000 mg | ORAL_TABLET | Freq: Every evening | ORAL | 0 refills | Status: DC | PRN

## 2019-09-12 ENCOUNTER — Other Ambulatory Visit: Payer: Self-pay | Admitting: Internal Medicine

## 2019-09-16 ENCOUNTER — Telehealth: Payer: Self-pay | Admitting: Internal Medicine

## 2019-09-16 NOTE — Telephone Encounter (Signed)
Pt is having severe back pain and would like to come in and get a cortisone shot for it if the provider thinks that she should. Pt would like to have a call back.

## 2019-09-16 NOTE — Telephone Encounter (Signed)
Pt has been scheduled for virtual viist and told we do not do cortisone shots

## 2019-09-17 ENCOUNTER — Telehealth: Payer: 59 | Admitting: Internal Medicine

## 2019-09-24 ENCOUNTER — Other Ambulatory Visit: Payer: Self-pay | Admitting: Obstetrics and Gynecology

## 2019-09-24 DIAGNOSIS — R928 Other abnormal and inconclusive findings on diagnostic imaging of breast: Secondary | ICD-10-CM

## 2019-10-01 ENCOUNTER — Other Ambulatory Visit: Payer: Self-pay

## 2019-10-01 ENCOUNTER — Ambulatory Visit
Admission: RE | Admit: 2019-10-01 | Discharge: 2019-10-01 | Disposition: A | Discharge status: Home / Self Care | Payer: 59 | Source: Ambulatory Visit | Attending: Obstetrics and Gynecology | Admitting: Obstetrics and Gynecology

## 2019-10-01 DIAGNOSIS — R928 Other abnormal and inconclusive findings on diagnostic imaging of breast: Secondary | ICD-10-CM

## 2020-02-04 ENCOUNTER — Other Ambulatory Visit: Payer: Self-pay | Admitting: Internal Medicine

## 2020-02-13 ENCOUNTER — Encounter: Payer: Self-pay | Admitting: Internal Medicine

## 2020-02-13 ENCOUNTER — Telehealth (INDEPENDENT_AMBULATORY_CARE_PROVIDER_SITE_OTHER): Payer: 59 | Admitting: Internal Medicine

## 2020-02-13 ENCOUNTER — Other Ambulatory Visit: Payer: Self-pay

## 2020-02-13 DIAGNOSIS — F419 Anxiety disorder, unspecified: Secondary | ICD-10-CM

## 2020-02-13 DIAGNOSIS — G4700 Insomnia, unspecified: Secondary | ICD-10-CM

## 2020-02-13 DIAGNOSIS — Z79899 Other long term (current) drug therapy: Secondary | ICD-10-CM | POA: Diagnosis not present

## 2020-02-13 DIAGNOSIS — Z6379 Other stressful life events affecting family and household: Secondary | ICD-10-CM

## 2020-02-13 MED ORDER — BUSPIRONE HCL 7.5 MG PO TABS: 7.5000 mg | ORAL_TABLET | Freq: Two times a day (BID) | ORAL | 1 refills | Status: DC

## 2020-02-13 MED ORDER — ALPRAZOLAM 0.5 MG PO TABS: 0.5000 mg | ORAL_TABLET | Freq: Every evening | ORAL | 0 refills | Status: DC | PRN
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
## 2020-02-14 ENCOUNTER — Ambulatory Visit: Payer: 59 | Attending: Internal Medicine

## 2020-02-14 DIAGNOSIS — Z23 Encounter for immunization: Secondary | ICD-10-CM

## 2020-02-14 NOTE — Progress Notes (Signed)
   Covid-19 Vaccination Clinic  Name:  SAFIA DRYE    MRN: [id number] DOB: 1986/01/25  02/14/2020  Ms. Kirkeby was observed post Covid-19 immunization for 15 minutes without incidence. She was provided with Vaccine Information Sheet and instruction to access the V-Safe system.   Ms. Appiah was instructed to call 911 with any severe reactions post vaccine: Marland Kitchen Difficulty breathing  . Swelling of your face and throat  . A fast heartbeat  . A bad rash all over your body  . Dizziness and weakness    Immunizations Administered    Name Date Dose VIS Date Route   Pfizer COVID-19 Vaccine 02/14/2020  9:10 AM 0.3 mL 11/28/2019 Intramuscular   Manufacturer: [place]   Lot: [id number]   [place]: [id number]

## 2020-02-15 ENCOUNTER — Ambulatory Visit: Payer: 59

## 2020-03-01 ENCOUNTER — Other Ambulatory Visit: Payer: Self-pay

## 2020-03-01 ENCOUNTER — Encounter: Payer: Self-pay | Admitting: Internal Medicine

## 2020-03-01 ENCOUNTER — Telehealth (INDEPENDENT_AMBULATORY_CARE_PROVIDER_SITE_OTHER): Payer: 59 | Admitting: Internal Medicine

## 2020-03-01 DIAGNOSIS — Z79899 Other long term (current) drug therapy: Secondary | ICD-10-CM | POA: Diagnosis not present

## 2020-03-01 DIAGNOSIS — G4700 Insomnia, unspecified: Secondary | ICD-10-CM

## 2020-03-01 DIAGNOSIS — F419 Anxiety disorder, unspecified: Secondary | ICD-10-CM | POA: Diagnosis not present

## 2020-03-01 NOTE — Progress Notes (Signed)
Virtual Visit via Video Note  I connected with@ on 03/01/20 at  8:30 AM EDT by a video enabled telemedicine application and verified that I am speaking with the correct person using two identifiers. Location patient: home Location provider:work or home office Persons participating in the virtual visit: patient, provider  [place] national recommendations  regarding COVID 19 pandemic   video visit is advised over in office visit for this patient.  Patient aware  of the limitations of evaluation and management by telemedicine and  availability of in person appointments. and agreed to proceed.   HPI: Samantha Webster presents for video visit Fu  Taking 7.5 bid  Of buspar  Has taken alpraz x 3 one at night and other 2 douring day when had unexpected stress trigger and did help .  No se noted x drowsiness  Over all doing "ok"    No change in health  ROS: See pertinent positives and negatives per HPI.  Past Medical History:  Diagnosis Date  . Family history of breast cancer     History reviewed. No pertinent surgical history.  Family History  Problem Relation Age of Onset  . Breast cancer Mother 37       deceased early onset.   . Breast cancer Maternal Grandmother 70       CHEK2 pos  . Skin cancer Maternal Grandfather   . Scleroderma Paternal Grandmother   . Breast cancer Other        paternal great aunt dx over 31  . Breast cancer Other        MGM's mother  . Prostate cancer Other        MGMs father    Social History   Tobacco Use  . Smoking status: Never Smoker  . Smokeless tobacco: Never Used  Substance Use Topics  . Alcohol use: Yes    Alcohol/week: 7.0 standard drinks    Types: 7 Glasses of wine per week  . Drug use: No      Current Outpatient Medications:  .  ALPRAZolam (XANAX) 0.5 MG tablet, Take 1 tablet (0.5 mg total) by mouth at bedtime as needed for anxiety., Disp: 20 tablet, Rfl: 0 .  busPIRone (BUSPAR) 7.5 MG tablet, Take 1 tablet (7.5 mg total) by mouth  2 (two) times daily. After 1 week may  Increase to 7.5 mg am and 15 mg pm for anxiety control, Disp: 90 tablet, Rfl: 1 .  cyclobenzaprine (FLEXERIL) 10 MG tablet, Take 1 tablet (10 mg total) by mouth 3 (three) times daily as needed for muscle spasms., Disp: 60 tablet, Rfl: 1 .  omeprazole (PRILOSEC) 40 MG capsule, TAKE 1 CAPSULE BY MOUTH EVERY DAY, Disp: 30 capsule, Rfl: 1  EXAM: BP Readings from Last 3 Encounters:  01/28/18 104/68  01/16/18 104/72  06/04/17 110/76    VITALS per patient if applicable:  GENERAL: alert, oriented, appears well and in no acute distress  HEENT: atraumatic, conjunttiva clear, no obvious abnormalities on inspection of external nose and ears  NECK: normal movements of the head and neck  LUNGS: on inspection no signs of respiratory distress, breathing rate appears normal, no obvious gross SOB, gasping or wheezing  CV: no obvious cyanosis   PSYCH/NEURO: pleasant and cooperative, no obvious depression or anxiety, speech and thought processing grossly intact No results found for: WBC, HGB, HCT, PLT, GLUCOSE, CHOL, TRIG, HDL, LDLDIRECT, LDLCALC, ALT, AST, NA, K, CL, CREATININE, BUN, CO2, TSH, PSA, INR, GLUF, HGBA1C, MICROALBUR  ASSESSMENT AND PLAN:  Discussed the following assessment and plan:    ICD-10-CM   1. Anxiety  F41.9   2. Medication management  Z79.899   3. Insomnia, unspecified type  G47.00     Counseled. Still planning to reach out for counseling  Inc BuSpar to 7.5 am 15 pm and then 15 mg bid  ROV  in 3 weeks virtual ok  Good uses distraction to limit augmentation of anxiety.   Expectant management and discussion of plan and treatment with opportunity to ask questions and all were answered. The patient agreed with the plan and demonstrated an understanding of the instructions.   Advised to call back or seek an in-person evaluation if worsening  or having  further concerns . Return in about 3 weeks (around 03/22/2020) for med check  .    Shanon Ace, MD

## 2020-03-09 MED ORDER — ALPRAZOLAM 1 MG PO TABS: 1.0000 mg | ORAL_TABLET | Freq: Every evening | ORAL | 0 refills | Status: DC | PRN

## 2020-03-09 NOTE — Telephone Encounter (Signed)
I sent in some 1mg  Xanax to try

## 2020-03-10 ENCOUNTER — Ambulatory Visit: Payer: 59 | Attending: Internal Medicine

## 2020-03-10 DIAGNOSIS — Z23 Encounter for immunization: Secondary | ICD-10-CM

## 2020-03-10 NOTE — Progress Notes (Signed)
   Covid-19 Vaccination Clinic  Name:  Samantha Webster    MRN: [id number] DOB: [birth year]  03/10/2020  Samantha Webster was observed post Covid-19 immunization for 15 minutes without incident. She was provided with Vaccine Information Sheet and instruction to access the V-Safe system.   Samantha Webster was instructed to call 911 with any severe reactions post vaccine: Marland Kitchen Difficulty breathing  . Swelling of face and throat  . A fast heartbeat  . A bad rash all over body  . Dizziness and weakness   Immunizations Administered    Name Date Dose VIS Date Route   Pfizer COVID-19 Vaccine 03/10/2020  9:00 AM 0.3 mL 11/28/2019 Intramuscular   Manufacturer: [place]   Lot: [id number]   [place]: [id number]

## 2020-03-11 ENCOUNTER — Other Ambulatory Visit: Payer: Self-pay | Admitting: Internal Medicine

## 2020-03-11 NOTE — Telephone Encounter (Signed)
ALPRAZolam Duanne Moron) 1 MG tablet  Patient needs Rx sent to: [id number] - [place], [place] Phone:  [phone]  Fax:  [phone]     Instead of: [facility] [place], [address] AT [place] Phone:  [phone]  Fax:  [phone]          busPIRone (BUSPAR) 7.5 MG tablet   The pharmacy is showing that she doesn't have any refills.   Please advise

## 2020-03-25 NOTE — Telephone Encounter (Signed)
Glad  You got some relief     Alprazolam is a controlled substance used for acute anxiety   And so  not a long term solution .  Is is dependent producing so  Can go through withdrawal if used long term and stop  prescribed  Or used Usually as an add on or Situational medication.   Please make  Another  appt video ok to decide  On medication management .

## 2020-03-26 ENCOUNTER — Telehealth (INDEPENDENT_AMBULATORY_CARE_PROVIDER_SITE_OTHER): Payer: 59 | Admitting: Internal Medicine

## 2020-03-26 ENCOUNTER — Encounter: Payer: Self-pay | Admitting: Internal Medicine

## 2020-03-26 ENCOUNTER — Other Ambulatory Visit: Payer: Self-pay

## 2020-03-26 VITALS — Temp 97.5°F | Ht 64.0 in | Wt 154.2 lb

## 2020-03-26 DIAGNOSIS — Z79899 Other long term (current) drug therapy: Secondary | ICD-10-CM | POA: Diagnosis not present

## 2020-03-26 DIAGNOSIS — G4700 Insomnia, unspecified: Secondary | ICD-10-CM | POA: Diagnosis not present

## 2020-03-26 DIAGNOSIS — F419 Anxiety disorder, unspecified: Secondary | ICD-10-CM | POA: Diagnosis not present

## 2020-03-26 MED ORDER — ALPRAZOLAM 1 MG PO TABS: 1.0000 mg | ORAL_TABLET | Freq: Every evening | ORAL | 0 refills | Status: DC | PRN

## 2020-03-26 MED ORDER — TRAZODONE HCL 50 MG PO TABS: 50.0000 mg | ORAL_TABLET | Freq: Every day | ORAL | 1 refills | Status: DC

## 2020-03-26 NOTE — Progress Notes (Signed)
Virtual Visit via Video Note  I connected with@ on 03/26/20 at  1:45 PM EDT by a video enabled telemedicine application and verified that I am speaking with the correct person using two identifiers. Location patient: home Location provider:home office Persons participating in the virtual visit: patient, provider  [place] national recommendations  regarding COVID 19 pandemic   video visit is advised over in office visit for this patient.  Patient aware  of the limitations of evaluation and management by telemedicine and  availability of in person appointments. and agreed to proceed.   HPI: Samantha Webster presents for video visit  See ntoes  Not  Good effect on buspar and sleeps better on  Benzo   But see past hx     Of labmictal and benzo in past  She took 1 mg of alprazolam at night did help her mind slow down and go to sleep. In the past she has had Ambien Lunesta Lamictal does not remember having trazodone not sure about Seroquel. She states that the stressor on her daughter is getting a little better with getting help. When she tried to increase the buspirone to 15 mg at night she had side effects of shocking-like feelings through her. No new symptoms otherwise she has basically decreased her buspirone to 7.5 mg in the morning only and does not appear helpful at that prior dose.  She gets a little depressed at times from all of this but mostly not depression but anxiety and hard to shut off her thoughts that keeps her awake.   ROS: See pertinent positives and negatives per HPI.  Past Medical History:  Diagnosis Date  . Family history of breast cancer     History reviewed. No pertinent surgical history.  Family History  Problem Relation Age of Onset  . Breast cancer Mother 42       deceased early onset.   . Breast cancer Maternal Grandmother 98       CHEK2 pos  . Skin cancer Maternal Grandfather   . Scleroderma Paternal Grandmother   . Breast cancer Other        paternal  great aunt dx over 69  . Breast cancer Other        MGM's mother  . Prostate cancer Other        MGMs father    Social History   Tobacco Use  . Smoking status: Never Smoker  . Smokeless tobacco: Never Used  Substance Use Topics  . Alcohol use: Yes    Alcohol/week: 7.0 standard drinks    Types: 7 Glasses of wine per week  . Drug use: No      Current Outpatient Medications:  .  ALPRAZolam (XANAX) 1 MG tablet, Take 1 tablet (1 mg total) by mouth at bedtime as needed for anxiety (avoid regular use)., Disp: 20 tablet, Rfl: 0 .  Cholecalciferol (VITAMIN D) 50 MCG (2000 UT) tablet, Take 2,000 Units by mouth daily., Disp: , Rfl:  .  cyclobenzaprine (FLEXERIL) 10 MG tablet, Take 1 tablet (10 mg total) by mouth 3 (three) times daily as needed for muscle spasms., Disp: 60 tablet, Rfl: 1 .  omeprazole (PRILOSEC) 40 MG capsule, TAKE 1 CAPSULE BY MOUTH EVERY DAY, Disp: 30 capsule, Rfl: 1 .  traMADol (ULTRAM) 50 MG tablet, Take 50 mg by mouth every 8 (eight) hours as needed., Disp: , Rfl:  .  Turmeric (QC TUMERIC COMPLEX PO), Take 450 tablets by mouth daily., Disp: , Rfl:  .  diphenhydrAMINE  HCl, Sleep, 50 MG CAPS, Unisom (diphenhydramine), Disp: , Rfl:  .  metaxalone (SKELAXIN) 800 MG tablet, Take 800 mg by mouth at bedtime., Disp: , Rfl:  .  traZODone (DESYREL) 50 MG tablet, Take 1 tablet (50 mg total) by mouth at bedtime. Can increase to  75 mg then 100 mg dose as  Needed, Disp: 40 tablet, Rfl: 1  EXAM: BP Readings from Last 3 Encounters:  01/28/18 104/68  01/16/18 104/72  06/04/17 110/76    VITALS per patient if applicable:  GENERAL: alert, oriented, appears well and in no acute distress talks quite fast but not pressured speech intact thought process. Pleasant  conversation  HEENT: atraumatic, conjunttiva clear, no obvious abnormalities on inspection of external nose and ears  NECK: normal movements of the head and neck  LUNGS: on inspection no signs of respiratory distress,  breathing rate appears normal, no obvious gross SOB, gasping or wheezing  CV: no obvious cyanosis  MS: moves all visible extremities without noticeable abnormality  PSYCH/NEURO: pleasant and cooperative, no obvious depression or anxiety, speech and thought processing grossly intact No results found for: WBC, HGB, HCT, PLT, GLUCOSE, CHOL, TRIG, HDL, LDLDIRECT, LDLCALC, ALT, AST, NA, K, CL, CREATININE, BUN, CO2, TSH, PSA, INR, GLUF, HGBA1C, MICROALBUR  ASSESSMENT AND PLAN:  Discussed the following assessment and plan:    ICD-10-CM   1. Anxiety  F41.9   2. Medication management  Z79.899   3. Insomnia, unspecified type  G47.00    Anxiety and  Sleep   Prev dx in ?s of  Bipolar and was on lamictal in past has been hesitant to use an SSRI plus Apparently alprazolam works really well at night for her in the short run but should not be used nightly in the long run. For now can refill alprazolam and try trazodone 50 to 100 mg expectations or not to have full solution to the anxiety. Plan send in message in 2 to 3 weeks decide on video visit at that time.  She will try taking the alprazolam out on if needed if not sleeping for a night or so. It is not unreasonable to take it nightly for 3-4 nights and then off.  Long-term if requires medication may need other input consult. Counseled.   Expectant management and discussion of plan and treatment with opportunity to ask questions and all were answered. The patient agreed with the plan and demonstrated an understanding of the instructions.  plan send in message  In  2-3 weeks and then go from there  Advised to call back or seek an in-person evaluation if worsening  or having  further concerns . Return for 2-3 weeks update message and then go from there.  30 minutes counseled a nd review  medication evaluation   Shanon Ace, MD

## 2020-04-19 ENCOUNTER — Other Ambulatory Visit: Payer: Self-pay

## 2020-04-19 ENCOUNTER — Encounter: Payer: Self-pay | Admitting: Internal Medicine

## 2020-04-19 ENCOUNTER — Telehealth (INDEPENDENT_AMBULATORY_CARE_PROVIDER_SITE_OTHER): Payer: 59 | Admitting: Internal Medicine

## 2020-04-19 VITALS — Ht 63.0 in | Wt 152.0 lb

## 2020-04-19 DIAGNOSIS — Z79899 Other long term (current) drug therapy: Secondary | ICD-10-CM

## 2020-04-19 DIAGNOSIS — F419 Anxiety disorder, unspecified: Secondary | ICD-10-CM | POA: Diagnosis not present

## 2020-04-19 DIAGNOSIS — M545 Low back pain, unspecified: Secondary | ICD-10-CM

## 2020-04-19 DIAGNOSIS — G4700 Insomnia, unspecified: Secondary | ICD-10-CM

## 2020-04-19 MED ORDER — ALPRAZOLAM 1 MG PO TABS: 1.0000 mg | ORAL_TABLET | Freq: Every evening | ORAL | 0 refills | Status: DC | PRN

## 2020-04-19 NOTE — Progress Notes (Signed)
Virtual Visit via Video Note  I connected with@ on 04/19/20 at  8:30 AM EDT by a video enabled telemedicine application and verified that I am speaking with the correct person using two identifiers. Location patient: home Location provider:home office Persons participating in the virtual visit: patient, provider  [place] national recommendations  regarding COVID 19 pandemic   video visit is advised over in office visit for this patient.  Patient aware  of the limitations of evaluation and management by telemedicine and  availability of in person appointments. and agreed to proceed.   HPI: Samantha Webster presents for video visit fu meds for anxiety and insomnia    Trazodone alone not working  Began 48  Nothing  X 2 73 no help and then  100 mg  Slept  well first night   Then the good effect stopped  Unless  1 mg alprazolam taken  Not taking most days.  In review   Was on pm alprazolam  1 mg rarely 2 mg as needed  for over 8 years until childbirth and weaned to [place] and did ok  until recently with may obligations and family stress that she is coping ok    No panic  Many meds not helping still trying techniques   Back pain almost daily  iussues using ice and mr as needed /.   Looking into cycling  Exercise  Other  Takes muscle  relaxant ocass not regular   ROS: See pertinent positives and negatives per HPI.  Past Medical History:  Diagnosis Date  . Family history of breast cancer     History reviewed. No pertinent surgical history.  Family History  Problem Relation Age of Onset  . Breast cancer Mother 61       deceased early onset.   . Breast cancer Maternal Grandmother 65       CHEK2 pos  . Skin cancer Maternal Grandfather   . Scleroderma Paternal Grandmother   . Breast cancer Other        paternal great aunt dx over 11  . Breast cancer Other        MGM's mother  . Prostate cancer Other        MGMs father    Social History   Tobacco Use  . Smoking status: Never Smoker   . Smokeless tobacco: Never Used  Substance Use Topics  . Alcohol use: Yes    Alcohol/week: 7.0 standard drinks    Types: 7 Glasses of wine per week  . Drug use: No      Current Outpatient Medications:  .  ALPRAZolam (XANAX) 1 MG tablet, Take 1 tablet (1 mg total) by mouth at bedtime as needed for anxiety (avoid regular use)., Disp: 30 tablet, Rfl: 0 .  Cholecalciferol (VITAMIN D) 50 MCG (2000 UT) tablet, Take 2,000 Units by mouth daily., Disp: , Rfl:  .  cyclobenzaprine (FLEXERIL) 10 MG tablet, Take 1 tablet (10 mg total) by mouth 3 (three) times daily as needed for muscle spasms., Disp: 60 tablet, Rfl: 1 .  metaxalone (SKELAXIN) 800 MG tablet, Take 800 mg by mouth as needed. , Disp: , Rfl:  .  omeprazole (PRILOSEC) 40 MG capsule, TAKE 1 CAPSULE BY MOUTH EVERY DAY, Disp: 30 capsule, Rfl: 1 .  traMADol (ULTRAM) 50 MG tablet, Take 50 mg by mouth every 8 (eight) hours as needed., Disp: , Rfl:  .  traZODone (DESYREL) 50 MG tablet, Take 1 tablet (50 mg total) by mouth at bedtime. Can  increase to  75 mg then 100 mg dose as  Needed, Disp: 40 tablet, Rfl: 1 .  Turmeric (QC TUMERIC COMPLEX PO), Take 450 tablets by mouth daily., Disp: , Rfl:  .  diphenhydrAMINE HCl, Sleep, 50 MG CAPS, Unisom (diphenhydramine), Disp: , Rfl:   EXAM: BP Readings from Last 3 Encounters:  01/28/18 104/68  01/16/18 104/72  06/04/17 110/76    VITALS per patient if applicable:  GENERAL: alert, oriented, appears well and in no acute distress  HEENT: atraumatic, conjunttiva clear, no obvious abnormalities on inspection of external nose and ears  NECK: normal movements of the head and neck  LUNGS: on inspection no signs of respiratory distress, breathing rate appears normal, no obvious gross SOB, gasping or wheezing  CV: no obvious cyanosis  MS: moves all visible extremities without noticeable abnormality  PSYCH/NEURO: pleasant and cooperative, no obvious depression or anxiety, speech and thought processing  grossly intactanimated but nl speech  No results found for: WBC, HGB, HCT, PLT, GLUCOSE, CHOL, TRIG, HDL, LDLDIRECT, LDLCALC, ALT, AST, NA, K, CL, CREATININE, BUN, CO2, TSH, PSA, INR, GLUF, HGBA1C, MICROALBUR  ASSESSMENT AND PLAN:  Discussed the following assessment and plan:    ICD-10-CM   1. Anxiety  F41.9   2. Insomnia, unspecified type  G47.00   3. Medication management  Z79.899   4. Recurrent low back pain  M54.5    reported had mri as teen reported disc diseaeserecnetly more a problem   Get opinion about back   Eual Fines  Let us know if need referral  consider yoga   Other activity  ( onyusing relaxant as needed for flare )  Counseled.  Min to no help from trazadone with a good trialMany medication not helpful for her sleep and although anxiety racing sx seem to be th trigger was able to be off med when having children . She has augmented   Pressure to sleep .  I suppose the risk benefit ratio in the short run is ok to take the alprazolam but  Looking forward to avoid reg  Use over a life time and look for signs or complications and tolerance  The ? Of  Bipolar  Per prev psych not sure if  Other meds to be helpful but if sx interfering with life in sig can see psych . For now will refill alparaz  30 and send in message in a month about status  And refills and go form there and fu    Expectant management and discussion of plan and treatment with opportunity to ask questions and all were answered. The patient agreed with the plan and demonstrated an understanding of the instructions.   Advised to call back or seek an in-person evaluation if worsening  or having  further concerns . Return for send in message my chart 1 mos and then go from there.    Shanon Ace, MD

## 2020-05-14 ENCOUNTER — Other Ambulatory Visit: Payer: Self-pay | Admitting: Internal Medicine

## 2020-05-19 ENCOUNTER — Other Ambulatory Visit: Payer: Self-pay | Admitting: Adult Health

## 2020-05-19 MED ORDER — ALPRAZOLAM 1 MG PO TABS: 1.0000 mg | ORAL_TABLET | Freq: Every evening | ORAL | 0 refills | Status: DC | PRN

## 2020-05-31 MED ORDER — ALPRAZOLAM 1 MG PO TABS: 1.0000 mg | ORAL_TABLET | Freq: Every evening | ORAL | 0 refills | Status: DC | PRN

## 2020-05-31 NOTE — Telephone Encounter (Signed)
Tell them to fill it early!

## 2020-05-31 NOTE — Telephone Encounter (Signed)
I sent in a refill  For 20 at this time

## 2020-05-31 NOTE — Telephone Encounter (Signed)
Sent in again with  Fill early instructions

## 2020-06-15 NOTE — Telephone Encounter (Signed)
Yes I agree   We need more input from specialists    [facility] [address], [place], [place] [address]  Phone: [phone]     Or [facility] [place] [place], [place] [address] [phone] [address] for appointments   have  psychiatrists and NP prescribers and counselors . Suggest you check who is in network  And  Make appt   ( you have to make  Your own appt  In behavioral health as they dont  Take referrals)  You may benefit from prescribes and counselors

## 2020-06-17 NOTE — Telephone Encounter (Signed)
I dont know her but  If you got a good recommendation then I have no reservations

## 2020-06-22 MED ORDER — ALPRAZOLAM 1 MG PO TABS: 1.0000 mg | ORAL_TABLET | Freq: Every evening | ORAL | 0 refills | Status: DC | PRN

## 2020-06-22 NOTE — Telephone Encounter (Signed)
Sent in electronically . Another refill for now

## 2020-06-26 ENCOUNTER — Other Ambulatory Visit: Payer: Self-pay | Admitting: Internal Medicine

## 2020-07-07 ENCOUNTER — Other Ambulatory Visit: Payer: Self-pay

## 2020-07-07 ENCOUNTER — Encounter: Payer: Self-pay | Admitting: Internal Medicine

## 2020-07-07 ENCOUNTER — Ambulatory Visit (INDEPENDENT_AMBULATORY_CARE_PROVIDER_SITE_OTHER)
Admission: RE | Admit: 2020-07-07 | Discharge: 2020-07-07 | Disposition: A | Discharge status: Home / Self Care | Payer: 59 | Source: Ambulatory Visit | Attending: Internal Medicine | Admitting: Internal Medicine

## 2020-07-07 ENCOUNTER — Ambulatory Visit: Payer: 59 | Admitting: Internal Medicine

## 2020-07-07 VITALS — BP 118/76 | HR 81 | Temp 98.0°F | Ht 64.0 in | Wt 149.8 lb

## 2020-07-07 DIAGNOSIS — N926 Irregular menstruation, unspecified: Secondary | ICD-10-CM

## 2020-07-07 DIAGNOSIS — F419 Anxiety disorder, unspecified: Secondary | ICD-10-CM

## 2020-07-07 DIAGNOSIS — R079 Chest pain, unspecified: Secondary | ICD-10-CM

## 2020-07-07 LAB — POCT URINE PREGNANCY: Preg Test, Ur: NEGATIVE

## 2020-07-07 NOTE — Progress Notes (Signed)
Chief Complaint  Patient presents with  . Anxiety    sharp heart and back pain behind her breast on left side and left side, severe sharp stabbing pain    HPI: Samantha Webster 34 y.o. come in fo  rsda  w above new onset concern   Last night had left chest pressure? And sharp pain back and side of chest  Some up to shoulder area  Woke this am  Interrupted w sleep when tried to roll over    and worse with deep breath also but no pleurisy cough or fever or sob   Mobility arm and also causing     Referred pain.  Described as Jolting pain  Left upper back when lays down  No history   similar  Sx   Yesterday work  No lifted  anyo ne   Anxiety at that time.  Anxiety med no change   Brisk walking  without problems  Right handed .  No hx of dvt blood clots  Periods are frequent now and has gyne check in a few months  No contraception felt to be unable to have children  ROS: See pertinent positives and negatives per HPI. Recent headaches  No vision changes  Heartburn is stable  Not the same pain  Anxiety is present  But no respinse to taking   alprazolam  Past Medical History:  Diagnosis Date  . Family history of breast cancer     Family History  Problem Relation Age of Onset  . Breast cancer Mother 59       deceased early onset.   . Breast cancer Maternal Grandmother 37       CHEK2 pos  . Skin cancer Maternal Grandfather   . Scleroderma Paternal Grandmother   . Breast cancer Other        paternal great aunt dx over 51  . Breast cancer Other        MGM's mother  . Prostate cancer Other        MGMs father    Social History   Socioeconomic History  . Marital status: Single    Spouse name: Not on file  . Number of children: Not on file  . Years of education: Not on file  . Highest education level: Not on file  Occupational History  . Not on file  Tobacco Use  . Smoking status: Never Smoker  . Smokeless tobacco: Never Used  Vaping Use  . Vaping Use: Never used    Substance and Sexual Activity  . Alcohol use: Yes    Alcohol/week: 7.0 standard drinks    Types: 7 Glasses of wine per week  . Drug use: No  . Sexual activity: Not on file  Other Topics Concern  . Not on file  Social History Narrative   Married    Works day care infant some toddlers    Social Determinants of Radio broadcast assistant Strain:   . Difficulty of Paying Living Expenses:   Food Insecurity:   . Worried About Charity fundraiser in the Last Year:   . Arboriculturist in the Last Year:   Transportation Needs:   . Film/video editor (Medical):   Marland Kitchen Lack of Transportation (Non-Medical):   Physical Activity:   . Days of Exercise per Week:   . Minutes of Exercise per Session:   Stress:   . Feeling of Stress :   Social Connections:   . Frequency of  Communication with Friends and Family:   . Frequency of Social Gatherings with Friends and Family:   . Attends Religious Services:   . Active Member of Clubs or Organizations:   . Attends Archivist Meetings:   Marland Kitchen Marital Status:     Outpatient Medications Prior to Visit  Medication Sig Dispense Refill  . ALPRAZolam (XANAX) 1 MG tablet Take 1 tablet (1 mg total) by mouth at bedtime as needed for anxiety (avoid regular use). Can fill early patient leaving town 20 tablet 0  . Cholecalciferol (VITAMIN D) 50 MCG (2000 UT) tablet Take 2,000 Units by mouth daily.    . Magnesium 100 MG CAPS Take 2 capsules by mouth daily.    Marland Kitchen omeprazole (PRILOSEC) 40 MG capsule TAKE 1 CAPSULE BY MOUTH EVERY DAY (Patient taking differently: Taking twice a week) 30 capsule 1  . cyclobenzaprine (FLEXERIL) 10 MG tablet Take 1 tablet (10 mg total) by mouth 3 (three) times daily as needed for muscle spasms. (Patient not taking: Reported on 07/07/2020) 60 tablet 1  . diphenhydrAMINE HCl, Sleep, 50 MG CAPS Unisom (diphenhydramine) (Patient not taking: Reported on 07/07/2020)    . metaxalone (SKELAXIN) 800 MG tablet Take 800 mg by mouth as  needed.  (Patient not taking: Reported on 07/07/2020)    . traMADol (ULTRAM) 50 MG tablet Take 50 mg by mouth every 8 (eight) hours as needed. (Patient not taking: Reported on 07/07/2020)    . traZODone (DESYREL) 50 MG tablet TAKE 1 TABLET (50 MG TOTAL) BY MOUTH AT BEDTIME. CAN INCREASE TO 75 MG THEN 100 MG DOSE AS NEEDED (Patient not taking: Reported on 07/07/2020) 40 tablet 1  . Turmeric (QC TUMERIC COMPLEX PO) Take 450 tablets by mouth daily. (Patient not taking: Reported on 07/07/2020)     No facility-administered medications prior to visit.     EXAM:  BP 118/76   Pulse 81   Temp 98 F (36.7 C) (Oral)   Ht '5\' 4"'  (1.626 m)   Wt 149 lb 12.8 oz (67.9 kg)   LMP 06/26/2020   SpO2 99%   BMI 25.71 kg/m   Body mass index is 25.71 kg/m.  GENERAL: vitals reviewed and listed above, alert, oriented, appears well hydrated and in no acute distress HEENT: atraumatic, conjunctiva  clear, no obvious abnormalities on inspection of external nose and ears OP : masked NECK: no obvious masses on inspection palpation  LUNGS: clear to auscultation bilaterally, no wheezes, rales or rhonchi, good air movement Breast: normal by inspection . No dimpling, discharge, masses, tenderness or discharge . CV: HRRR, no clubbing cyanosis or  peripheral edema nl cap refill  Abdomen:  Sof,t normal bowel sounds without hepatosplenomegaly, no guarding rebound or masses no CVA tenderness MS: moves all extremities without noticeable focal  Abnormality nl rom  No mid line tenderness  Shoulder rom but pain located upper left chest and mid paraspinal back   No point bony tenderness   Legs clear no  Swelling  PSYCH: pleasant and cooperative, no obvious depression mild  anxiety No results found for: WBC, HGB, HCT, PLT, GLUCOSE, CHOL, TRIG, HDL, LDLDIRECT, LDLCALC, ALT, AST, NA, K, CL, CREATININE, BUN, CO2, TSH, PSA, INR, GLUF, HGBA1C, MICROALBUR BP Readings from Last 3 Encounters:  07/07/20 118/76  01/28/18 104/68  01/16/18  104/72  poct preg negative  EKG  nsr v 1 r r'    ASSESSMENT AND PLAN:  Discussed the following assessment and plan:  Left-sided chest pain - Plan: DG Chest 2  View, CBC with Differential/Platelet, Hepatic function panel, Lipid panel, TSH, T4, free, Sedimentation rate, BASIC METABOLIC PANEL WITH GFR, POCT urine pregnancy, BASIC METABOLIC PANEL WITH GFR, Sedimentation rate, T4, free, TSH, Lipid panel, Hepatic function panel, CBC with Differential/Platelet  Chest pain, unspecified type - Plan: EKG 12-Lead, DG Chest 2 View, CBC with Differential/Platelet, Hepatic function panel, Lipid panel, TSH, T4, free, Sedimentation rate, BASIC METABOLIC PANEL WITH GFR, POCT urine pregnancy, BASIC METABOLIC PANEL WITH GFR, Sedimentation rate, T4, free, TSH, Lipid panel, Hepatic function panel, CBC with Differential/Platelet  Irregular periods - Plan: CBC with Differential/Platelet, Hepatic function panel, Lipid panel, TSH, T4, free, Sedimentation rate, BASIC METABOLIC PANEL WITH GFR, POCT urine pregnancy, BASIC METABOLIC PANEL WITH GFR, Sedimentation rate, T4, free, TSH, Lipid panel, Hepatic function panel, CBC with Differential/Platelet  Anxiety - secondary not promary cause of sx Wells score is low 0  Seems like ms nerve pain  Uncertain cause  Exam is good  Headaches could be related to frequent periods irreg and hommonal  -Patient advised to return or notify health care team  if  new concerns arise.  Patient Instructions  Your exam is reassuring  And EKG is normal  Most likely from a pinched nerve in the chest wall  although cannot say why .  Get lab and chest x ray today . Can use Ibuprofen 800 mg every 8 - 12 hours   May help with  pain if   Not bothering stomach   Activity as tolerated and expect to improve over the next 4-5 days   Sometimes longer   Contact team if fever  New sx    persistent or progressive .     Standley Brooking. Janella Rogala M.D.

## 2020-07-07 NOTE — Progress Notes (Signed)
Good news  chest x ray is  normal  no concerns

## 2020-07-07 NOTE — Patient Instructions (Signed)
Your exam is reassuring  And EKG is normal  Most likely from a pinched nerve in the chest wall  although cannot say why .  Get lab and chest x ray today . Can use Ibuprofen 800 mg every 8 - 12 hours   May help with  pain if   Not bothering stomach   Activity as tolerated and expect to improve over the next 4-5 days   Sometimes longer   Contact team if fever  New sx    persistent or progressive .

## 2020-07-08 LAB — CBC WITH DIFFERENTIAL/PLATELET
Absolute Monocytes: 523 cells/uL (ref 200–950)
Basophils Absolute: 50 cells/uL (ref 0–200)
Basophils Relative: 0.6 %
Eosinophils Absolute: 241 cells/uL (ref 15–500)
Eosinophils Relative: 2.9 %
HCT: 42.4 % (ref 35.0–45.0)
Hemoglobin: 14.2 g/dL (ref 11.7–15.5)
Lymphs Abs: 2050 cells/uL (ref 850–3900)
MCH: 30.7 pg (ref 27.0–33.0)
MCHC: 33.5 g/dL (ref 32.0–36.0)
MCV: 91.8 fL (ref 80.0–100.0)
MPV: 10.1 fL (ref 7.5–12.5)
Monocytes Relative: 6.3 %
Neutro Abs: 5437 cells/uL (ref 1500–7800)
Neutrophils Relative %: 65.5 %
Platelets: 263 10*3/uL (ref 140–400)
RBC: 4.62 10*6/uL (ref 3.80–5.10)
RDW: 12.6 % (ref 11.0–15.0)
Total Lymphocyte: 24.7 %
WBC: 8.3 10*3/uL (ref 3.8–10.8)

## 2020-07-08 LAB — HEPATIC FUNCTION PANEL
AG Ratio: 1.9 (calc) (ref 1.0–2.5)
ALT: 14 U/L (ref 6–29)
AST: 16 U/L (ref 10–30)
Albumin: 4.4 g/dL (ref 3.6–5.1)
Alkaline phosphatase (APISO): 37 U/L (ref 31–125)
Bilirubin, Direct: 0.1 mg/dL (ref 0.0–0.2)
Globulin: 2.3 g/dL (calc) (ref 1.9–3.7)
Indirect Bilirubin: 0.6 mg/dL (calc) (ref 0.2–1.2)
Total Bilirubin: 0.7 mg/dL (ref 0.2–1.2)
Total Protein: 6.7 g/dL (ref 6.1–8.1)

## 2020-07-08 LAB — BASIC METABOLIC PANEL WITH GFR
BUN: 15 mg/dL (ref 7–25)
CO2: 27 mmol/L (ref 20–32)
Calcium: 9.2 mg/dL (ref 8.6–10.2)
Chloride: 102 mmol/L (ref 98–110)
Creat: 0.68 mg/dL (ref 0.50–1.10)
GFR, Est African American: 130 mL/min/{1.73_m2} (ref >=60)
GFR, Est Non African American: 113 mL/min/{1.73_m2} (ref >=60)
Glucose, Bld: 84 mg/dL (ref 65–99)
Potassium: 4.1 mmol/L (ref 3.5–5.3)
Sodium: 137 mmol/L (ref 135–146)

## 2020-07-08 LAB — SEDIMENTATION RATE: Sed Rate: 2 mm/h (ref 0–20)

## 2020-07-08 LAB — LIPID PANEL
Cholesterol: 181 mg/dL (ref <200)
HDL: 64 mg/dL (ref >=50)
LDL Cholesterol (Calc): 100 mg/dL (calc) — ABNORMAL HIGH
Non-HDL Cholesterol (Calc): 117 mg/dL (calc) (ref <130)
Total CHOL/HDL Ratio: 2.8 (calc) (ref <5.0)
Triglycerides: 84 mg/dL (ref <150)

## 2020-07-08 LAB — TSH: TSH: 1.15 mIU/L

## 2020-07-08 LAB — T4, FREE: Free T4: 1.2 ng/dL (ref 0.8–1.8)

## 2020-07-08 NOTE — Progress Notes (Signed)
Blood results are good    Including thyroid blood sugar  liver  and cholesterol is favorable

## 2020-07-13 MED ORDER — ALPRAZOLAM 1 MG PO TABS: 1.0000 mg | ORAL_TABLET | Freq: Every evening | ORAL | 0 refills | Status: DC | PRN

## 2020-07-13 NOTE — Telephone Encounter (Signed)
Sent in refill x 1

## 2020-07-20 ENCOUNTER — Other Ambulatory Visit: Payer: Self-pay | Admitting: Internal Medicine

## 2020-09-13 ENCOUNTER — Other Ambulatory Visit: Payer: Self-pay

## 2020-09-13 MED ORDER — OMEPRAZOLE 40 MG PO CPDR: DELAYED_RELEASE_CAPSULE | ORAL | 0 refills | Status: DC

## 2020-12-28 NOTE — Telephone Encounter (Signed)
If you just had COVID you do not need to repeat the test.  Our recommendations are no repeat test for 90 days.  Yes everyone is being exposed whether they know it or not. If you are not sick that is a good sign and you are probably reasonably protected. There is no way we can tell whether you had delta or a more chronic.  But natural immunity probably does some cross reaction more than the vaccine.  In that way he should feel reassured.  If you have further questions make a virtual visit with me and we can discuss more

## 2021-02-19 IMAGING — MG MM DIGITAL DIAGNOSTIC UNILAT*L* W/ TOMO W/ CAD
1 series · 2 of 5 positions shown · non-contrast
Comparison: Previous exam(s).

CLINICAL DATA: 33-year-old patient family history of breast cancer
was recalled from recent screening mammogram for evaluation of a
possible asymmetry posterior third the upper-outer left breast.

EXAM:
DIGITAL DIAGNOSTIC LEFT MAMMOGRAM WITH TOMO
ULTRASOUND LEFT BREAST

[L CC tomo · 2 of 70 frames shown]
[frame 23/70]
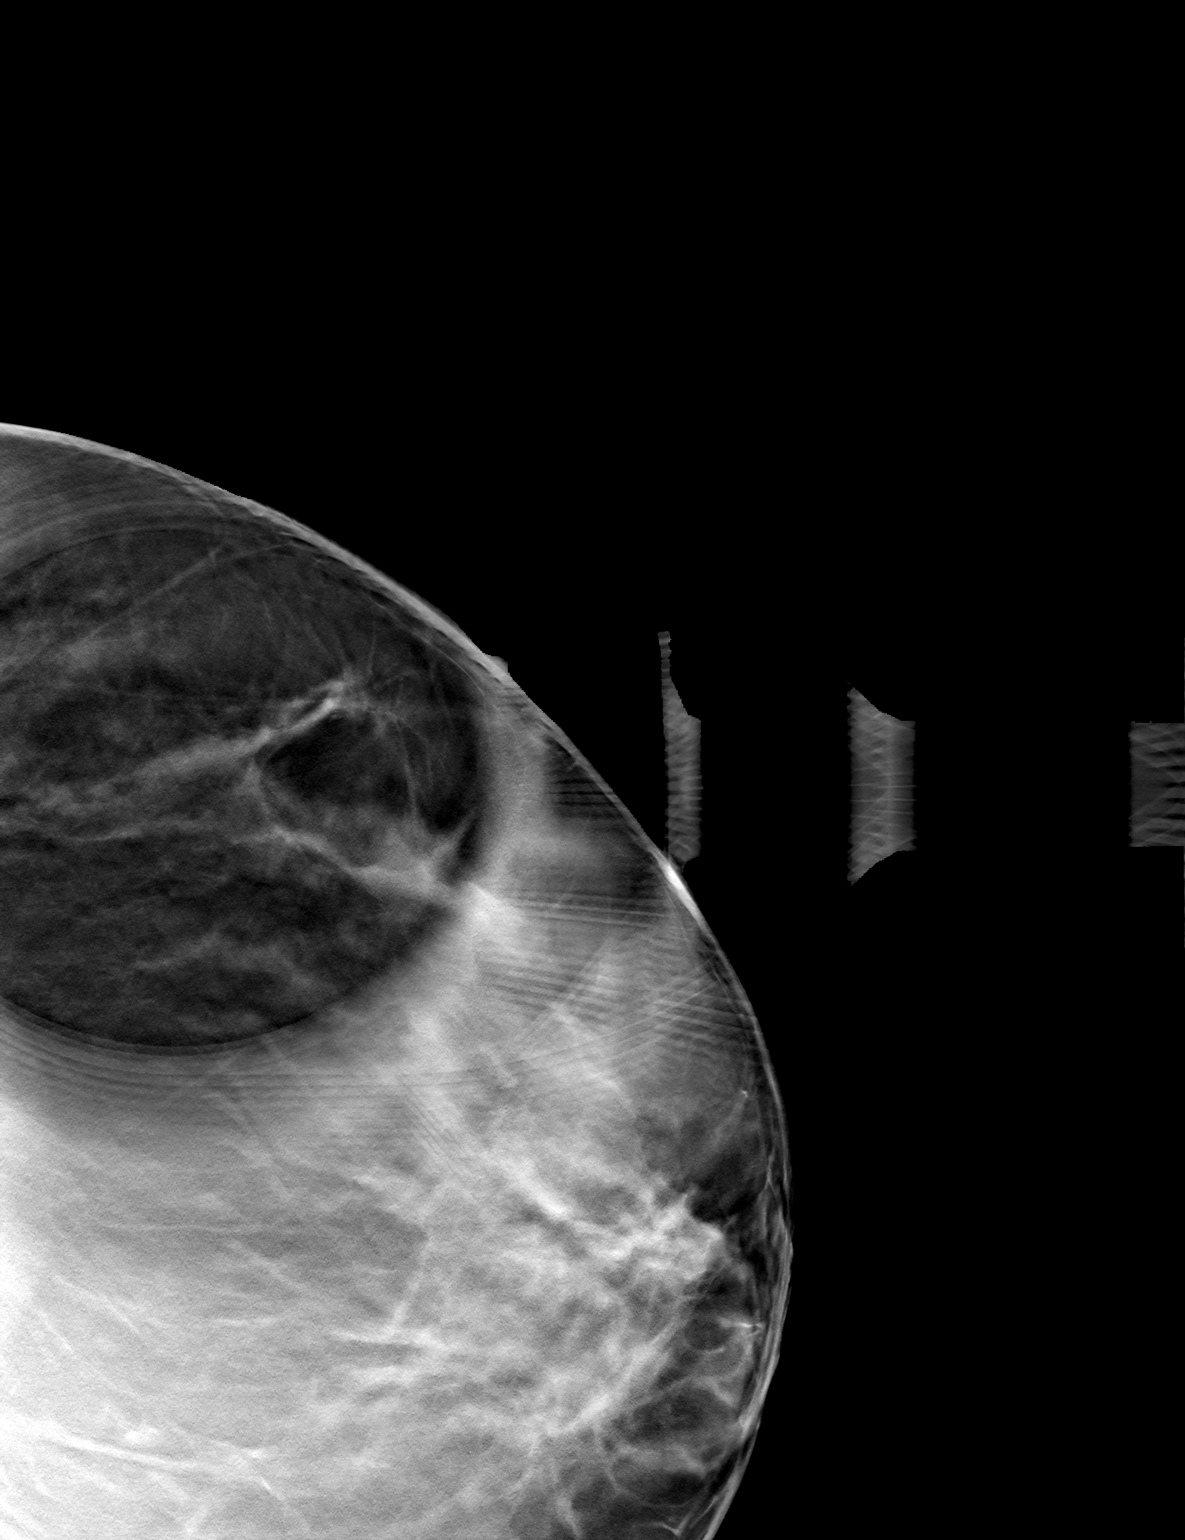
[frame 35/70]
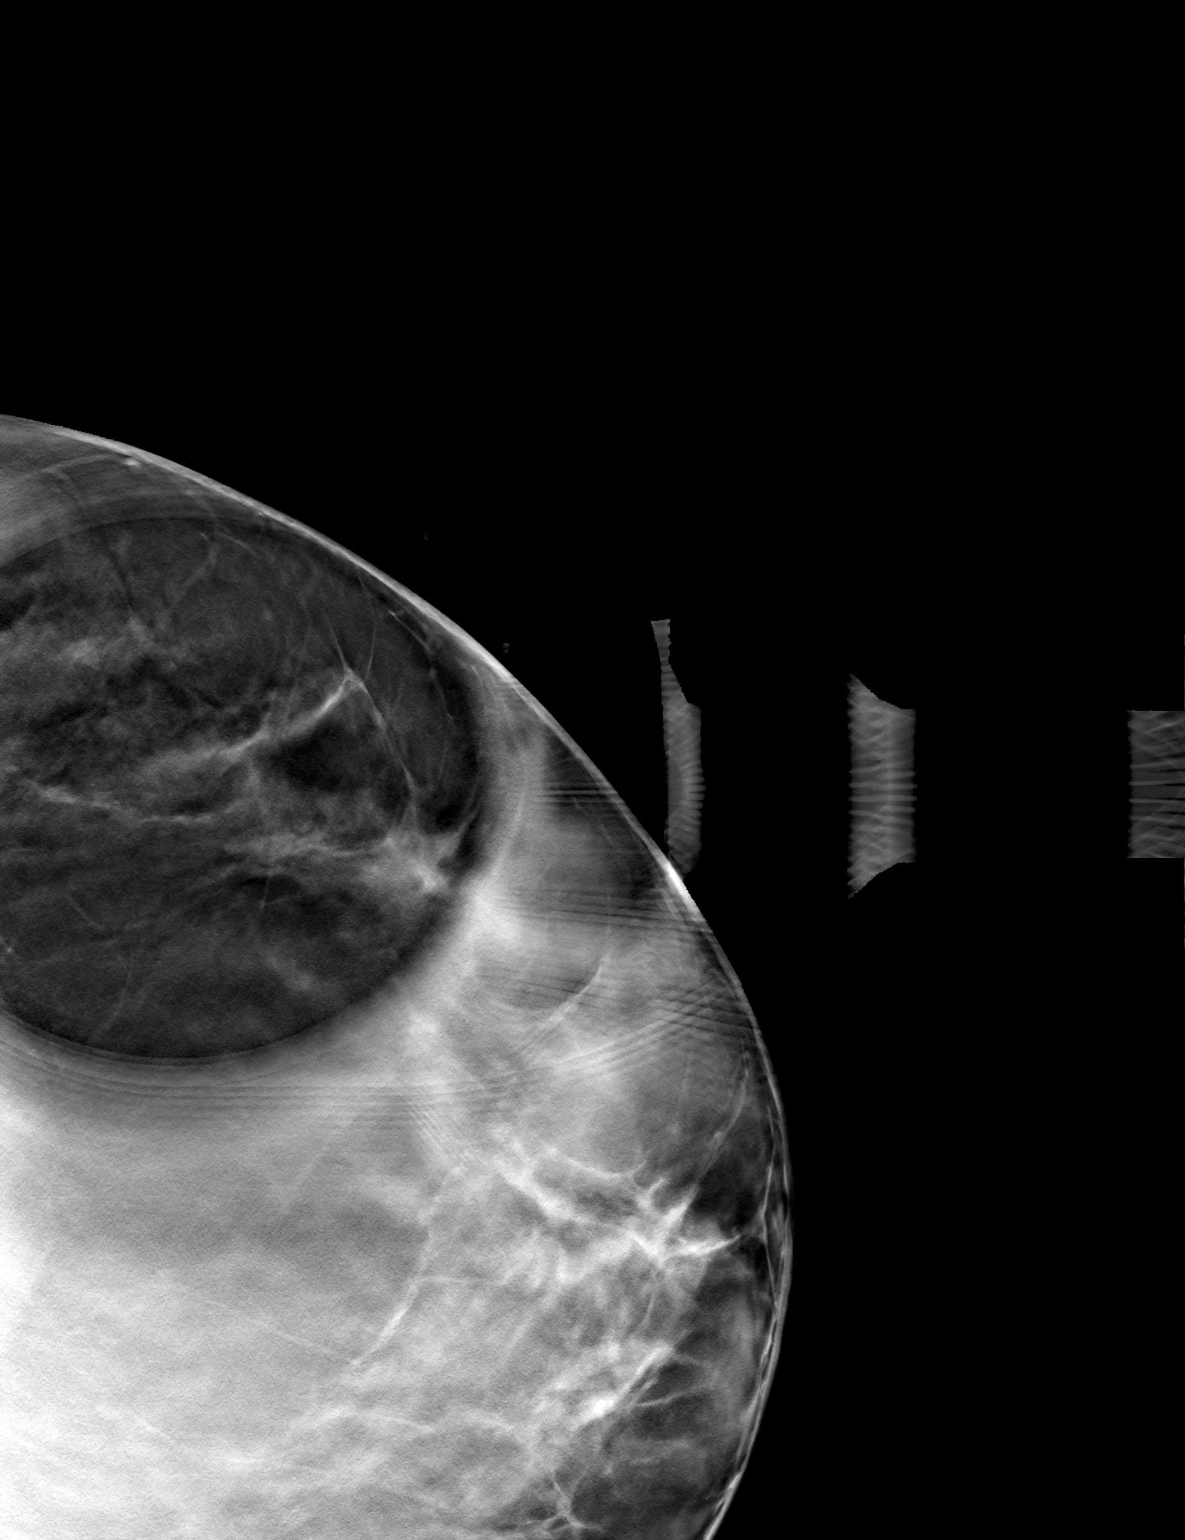

[2 of 5 positions shown; findings below may reference images not displayed]

ACR Breast Density Category c: The breast tissue is heterogeneously
dense, which may obscure small masses.
FINDINGS: Spot compression images with tomography of the posterior third of
the upper outer left breast show a probable island of normal
fibroglandular tissue. No suspicious findings are seen.

Targeted ultrasound is performed, showing normal fibroglandular
tissue in the 2 o'clock region of the left breast approximately 10
cm from nipple thought to correspond to the asymmetric tissue seen
on the screening mammogram. No suspicious findings are seen on
ultrasound.
IMPRESSION: No evidence of malignancy in the left breast.

RECOMMENDATION:
Screening mammogram in one year.(Code:[id number])

I have discussed the findings and recommendations with the patient.
If applicable, a reminder letter will be sent to the patient
regarding the next appointment.

BI-RADS CATEGORY  1: Negative.

## 2021-05-20 MED ORDER — METHYLPREDNISOLONE 4 MG PO TBPK: ORAL_TABLET | ORAL | 0 refills | Status: DC

## 2021-05-20 NOTE — Telephone Encounter (Signed)
I sent in a Medrol dose pack  

## 2021-05-20 NOTE — Telephone Encounter (Signed)
Pt called to check the status of below msg and she is aware of Dr. Velora Mediate hours of operation and that we do not have any availability today and she stated that she would seek a UC.  Pt is aware that if she needs [place] to give [place] a call back.

## 2021-05-23 ENCOUNTER — Other Ambulatory Visit: Payer: Self-pay

## 2021-05-23 MED ORDER — OMEPRAZOLE 40 MG PO CPDR: DELAYED_RELEASE_CAPSULE | ORAL | 0 refills | Status: DC

## 2021-07-06 ENCOUNTER — Other Ambulatory Visit: Payer: Self-pay | Admitting: Internal Medicine

## 2021-10-19 ENCOUNTER — Other Ambulatory Visit: Payer: Self-pay | Admitting: Internal Medicine

## 2021-12-25 ENCOUNTER — Encounter: Payer: Self-pay | Admitting: Internal Medicine

## 2021-12-26 NOTE — Telephone Encounter (Signed)
Cannot say for sure but it looks like a version of blushing or flushing on the chest which can be caused by medications ,anxiety ,perhaps certain foods spicy ,caffeine, alcohol and perhaps ambient temperature changes. '  I do not have enough information for further advice   you could consider seeing a dermatologist for advice and intervention. Or make a virtual visit to discuss

## 2023-03-21 ENCOUNTER — Encounter: Payer: Self-pay | Admitting: Internal Medicine

## 2023-03-21 ENCOUNTER — Other Ambulatory Visit: Payer: Self-pay

## 2023-03-21 ENCOUNTER — Emergency Department (HOSPITAL_BASED_OUTPATIENT_CLINIC_OR_DEPARTMENT_OTHER)
Admission: EM | Admit: 2023-03-21 | Discharge: 2023-03-21 | Disposition: A | Discharge status: Home / Self Care | Payer: BC Managed Care – PPO | Attending: Emergency Medicine | Admitting: Emergency Medicine

## 2023-03-21 DIAGNOSIS — M545 Low back pain, unspecified: Secondary | ICD-10-CM | POA: Insufficient documentation

## 2023-03-21 MED ORDER — LIDOCAINE 5 % EX PTCH
1.0000 | MEDICATED_PATCH | CUTANEOUS | Status: DC
  Administered 2023-03-21: 1 via TRANSDERMAL
  Filled 2023-03-21: qty 1

## 2023-03-21 MED ORDER — ACETAMINOPHEN 500 MG PO TABS
1000.0000 mg | ORAL_TABLET | Freq: Once | ORAL | Status: AC
  Administered 2023-03-21: 1000 mg via ORAL
  Filled 2023-03-21: qty 2

## 2023-03-21 MED ORDER — CYCLOBENZAPRINE HCL 10 MG PO TABS: 10.0000 mg | ORAL_TABLET | Freq: Two times a day (BID) | ORAL | 0 refills | Status: DC | PRN

## 2023-03-21 MED ORDER — LIDOCAINE 5 % EX PTCH: 1.0000 | MEDICATED_PATCH | CUTANEOUS | 0 refills | Status: DC

## 2023-03-21 MED ORDER — MELOXICAM 7.5 MG PO TABS: 7.5000 mg | ORAL_TABLET | Freq: Every day | ORAL | 0 refills | Status: AC

## 2023-03-21 NOTE — ED Provider Notes (Signed)
[facility] Provider Note   CSN: [id number] Arrival date & time: 03/21/23  0920     History  Chief Complaint  Patient presents with   Back Pain   HPI Samantha Webster is a 37 y.o. female presenting for lower back pain.  Started yesterday.  It is in the lower mid back.  At times radiates down her left leg to her feet.  She was playing bocce ball on Sunday and felt "tightness" in her lower back.  She thinks this is what may have caused her symptoms today.  Patient states she has a history of sciatica and a herniated disc in her lower back.  States she was diagnosed with herniated disc about 16 years ago.  She has intermittent lower back pain.  States her pain is similar as in times past.  Denies saddle anesthesia, urinary bowel continence.,  Fever and trauma.  Took Meloxicam this morning which helped today.  Patient requesting pain relief and referral to a neurosurgeon.  Patient believes "that is time to consider back surgery".  Denies urinary changes.   Back Pain      Home Medications Prior to Admission medications   Medication Sig Start Date End Date Taking? Authorizing Provider  cyclobenzaprine (FLEXERIL) 10 MG tablet Take 1 tablet (10 mg total) by mouth 2 (two) times daily as needed for muscle spasms. 03/21/23  Yes Harriet Pho, PA-C  lidocaine (LIDODERM) 5 % Place 1 patch onto the skin daily. Remove & Discard patch within 12 hours or as directed by MD 03/21/23  Yes Harriet Pho, PA-C  meloxicam (MOBIC) 7.5 MG tablet Take 1 tablet (7.5 mg total) by mouth daily. 03/21/23 04/20/23 Yes Harriet Pho, PA-C  ALPRAZolam Duanne Moron) 1 MG tablet Take 1 tablet (1 mg total) by mouth at bedtime as needed for anxiety (avoid regular use). 07/13/20   Panosh, Standley Brooking, MD  Cholecalciferol (VITAMIN D) 50 MCG (2000 UT) tablet Take 2,000 Units by mouth daily.    [provider]  diphenhydrAMINE HCl, Sleep, 50 MG CAPS Unisom (diphenhydramine) Patient not  taking: Reported on 07/07/2020    [provider]  Magnesium 100 MG CAPS Take 2 capsules by mouth daily.    [provider]  metaxalone (SKELAXIN) 800 MG tablet Take 800 mg by mouth as needed.  Patient not taking: Reported on 07/07/2020 12/13/19   [provider]  methylPREDNISolone (MEDROL DOSEPAK) 4 MG TBPK tablet As directed 05/20/21   Laurey Morale, MD  omeprazole (PRILOSEC) 40 MG capsule Take 1 capsule by mouth daily. **Please schedule appointment for further refills.** 10/20/21   Panosh, Standley Brooking, MD  traMADol (ULTRAM) 50 MG tablet Take 50 mg by mouth every 8 (eight) hours as needed. Patient not taking: Reported on 07/07/2020 12/13/19   [provider]  traZODone (DESYREL) 50 MG tablet TAKE 1 TABLET (50 MG TOTAL) BY MOUTH AT BEDTIME. CAN INCREASE TO 75 MG THEN 100 MG DOSE AS NEEDED Patient not taking: Reported on 07/07/2020 05/14/20   Panosh, Standley Brooking, MD  Turmeric (QC TUMERIC COMPLEX PO) Take 450 tablets by mouth daily. Patient not taking: Reported on 07/07/2020    [provider]      Allergies    Benzyl alcohol and Penicillins    Review of Systems   Review of Systems  Musculoskeletal:  Positive for back pain.    Physical Exam   Vitals:   03/21/23 0930  BP: 117/81  Pulse: 74  Resp: 16  Temp: 98.1 F (36.7 C)  SpO2: 99%    CONSTITUTIONAL:  well-appearing, NAD NEURO:  Alert and oriented x 3, CN 3-12 grossly intact EYES:  eyes equal and reactive ENT/NECK:  Supple, no stridor  CARDIO:  regular rate and rhythm, appears well-perfused  PULM:  No respiratory distress, CTAB GI/GU:  non-distended, soft MSK/SPINE: Range of motion in the back appears normal without tenderness.  No midline tenderness of the lower back or overlying the left sciatic notch.  Gait is steady but patient did endorse left lower back pain and left buttock pain while ambulating around the room. SKIN:  no rash, atraumatic  *Additional and/or pertinent findings included  in MDM below  ED Results / Procedures / Treatments   Labs (all labs ordered are listed, but only abnormal results are displayed) Labs Reviewed - No data to display  EKG None  Radiology No results found.  Procedures Procedures    Medications Ordered in ED Medications  acetaminophen (TYLENOL) tablet 1,000 mg (has no administration in time range)  lidocaine (LIDODERM) 5 % 1 patch (has no administration in time range)    ED Course/ Medical Decision Making/ A&P                             Medical Decision Making  37 year old well-appearing female presenting for lower back pain.  Exam was overall unremarkable.  DDx includes sciatica, radiculopathy secondary to stated history of herniated disc, and cauda equina syndrome.  Given the longevity of her back pain with history of herniated disc, this is likely the etiology of her pain.  Advised patient that she should follow-up with neuro surgery for further evaluation.  Treated her pain with Tylenol and Lidoderm patch.  Sent Flexeril, meloxicam and Lidoderm to her pharmacy.  Discussed return precautions.  Discharged with stable vitals.        Final Clinical Impression(s) / ED Diagnoses Final diagnoses:  Low back pain, unspecified back pain laterality, unspecified chronicity, unspecified whether sciatica present    Rx / DC Orders ED Discharge Orders          Ordered    meloxicam (MOBIC) 7.5 MG tablet  Daily        03/21/23 0955    cyclobenzaprine (FLEXERIL) 10 MG tablet  2 times daily PRN        03/21/23 0955    lidocaine (LIDODERM) 5 %  Every 24 hours        03/21/23 0956              Gareth Eagle, PA-C 03/21/23 1002    Vanetta Mulders, MD 03/23/23 1627

## 2023-03-21 NOTE — Telephone Encounter (Signed)
I see that you were evaluated in the UC ed and referred to Neurosurgery I agree  with the plan and hope you feel better soon.

## 2023-03-21 NOTE — Discharge Instructions (Addendum)
Evaluation for your lower back pain was overall reassuring.  It is likely associated with your herniated disc in someway.  I do believe it warrants follow-up with neurosurgery.  If you have new saddle anesthesia, urinary or bowel incontinence, fever, new trauma to your back, numbness or weakness in your extremities or any other concerning symptom please return emergency department further evaluation.  I have sent meloxicam, Flexeril and Lidoderm patches to your pharmacy for treatment at home.

## 2023-03-21 NOTE — ED Notes (Signed)
Pt standing in room, pt states that she is ready to go home, pt reports decreased pain from this am, states that this am her pain was a 10, states it is currently a 4/10, pt ambulatory from dpt with sig other.

## 2023-03-21 NOTE — ED Triage Notes (Signed)
Pain onset yesterday lower mid back radiates into left leg  Sometimes down into foot.

## 2023-04-09 NOTE — Therapy (Signed)
OUTPATIENT PHYSICAL THERAPY THORACOLUMBAR EVALUATION   Patient Name: Samantha Webster MRN: [id number] DOB:12-13-1986, 37 y.o., female Today's Date: 04/10/2023  END OF SESSION:  PT End of Session - 04/10/23 0841     Visit Number 1    Date for PT Re-Evaluation 06/08/23    Authorization Type BCBS    PT Start Time 0806    PT Stop Time 0844    PT Time Calculation (min) 38 min    Activity Tolerance Patient tolerated treatment well    Behavior During Therapy [facility] for tasks assessed/performed             Past Medical History:  Diagnosis Date   Family history of breast cancer    History reviewed. No pertinent surgical history. Patient Active Problem List   Diagnosis Date Noted   Genetic testing 02/05/2018   Family history of breast cancer    Family hx-breast malignancy 03/25/2013   Sciatica of right side 06/21/2012   Right groin pain 06/21/2012   Recurrent low back pain 06/21/2012   NEOPLASM, SKIN, UNCERTAIN BEHAVIOR 06/12/2008   ACNE, CYSTIC 06/12/2008   LYMPH NODE-ENLARGED 10/04/2007    PCP: Berniece Andreas, MD  REFERRING PROVIDER: Coletta Memos, MD  REFERRING DIAG: disc displacement, lumbar   Rationale for Evaluation and Treatment: Rehabilitation  THERAPY DIAG:  Other low back pain - Plan: PT plan of care cert/re-cert  Cramp and spasm - Plan: PT plan of care cert/re-cert  ONSET DATE: chronic with recent flare-up 4 weeks ago  SUBJECTIVE:                                                                                                                                                                                           SUBJECTIVE STATEMENT: Pt presents to PT with LBP.   Pt reports that she has a herniated disc that occurred in the early 2000s. Pt reports that she has had intermittent flare-ups of LBP that is debilitating at times.  Most recent flare-up was 4 weeks ago and she went to ED.  She reports that she has had PT in the past and has not stayed consistent with  the exercises.  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   MD note states that insurance company denied MRI.  PERTINENT HISTORY:  Chronic LBP  PAIN:  Are you having pain? Yes: NPRS scale: 2-3/10 Pain location: thoracic to lumbar spine Pain description: intense, stiffness, sharp Aggravating factors: movement, lifting, hinging at hips  Relieving factors: time, pain medication  PRECAUTIONS: None  WEIGHT BEARING RESTRICTIONS: No  FALLS:  Has patient fallen  in last 6 months? No  LIVING ENVIRONMENT: Lives with: lives with their family Lives in: House/apartment   OCCUPATION: preschool teacher   PLOF: Independent, preschool teacher   PATIENT GOALS: reduce flare-ups of low back pain  NEXT MD VISIT: none   OBJECTIVE:   DIAGNOSTIC FINDINGS:  None recent  PATIENT SURVEYS:  04/10/23: FOTO 58, goal is 43  SCREENING FOR RED FLAGS: Bowel or bladder incontinence: No Spinal tumors: No Cauda equina syndrome: No Compression fracture: No Abdominal aneurysm: No  COGNITION: Overall cognitive status: Within functional limits for tasks assessed     SENSATION: WFL  MUSCLE LENGTH: WFLs in hips  POSTURE: forward head and flexed trunk   PALPATION: Tension in Rt>Lt lumbar paraspinals and reduced PA mobility L1-5, minor trigger points in bil gluteals   LUMBAR ROM:  Full lumbar A/ROM with limited lumbar segmental mobility in all directions.  LOWER EXTREMITY ROM:    WFLs bilaterally  LOWER EXTREMITY MMT:   5/5 knee and hip strength bil except hip extension 4+/5 bil  LUMBAR SPECIAL TESTS:  Slump test: Negative  GAIT: Distance walked: 50 Assistive device utilized: None Level of assistance: Complete Independence Comments: No gait abnormalities   TODAY'S TREATMENT:                                                                                                                              DATE: 04/10/23 HEP established-see below      PATIENT EDUCATION:  Education details: Access Code: [email or web address], [email or web address]  Person educated: Patient Education method: Explanation, Demonstration, and Handouts Education comprehension: verbalized understanding and returned demonstration  HOME EXERCISE PROGRAM: Access Code: [email or web address] URL: [email or web address] Date: 04/10/2023 Prepared by: Tresa Endo  Exercises - Sidelying Open Book Thoracic Lumbar Rotation and Extension  - 2-3 x daily - 7 x weekly - 1 sets - 10 reps - Seated Piriformis Stretch with Trunk Bend  - 3 x daily - 7 x weekly - 1 sets - 3 reps - 20 hold - Seated Hamstring Stretch  - 3 x daily - 7 x weekly - 1 sets - 3 reps - 20 hold - Supine Transversus Abdominis Bracing - Hands on Stomach  - 1 x daily - 7 x weekly - 1 sets - 10 reps - 5 hold - Seated Transversus Abdominis Bracing  - 1 x daily - 7 x weekly - 1 sets - 10 reps - 5 hold  ASSESSMENT:  CLINICAL IMPRESSION: Patient is a 37 y.o. female who was seen today for physical therapy evaluation and treatment for chronic LBP. Pt reports that she has significant flare-up of pain intermittently and this has been going on for years.  Pt reports MRI in the early 2000s showed HNP in the lumbar spine and she would like to have one now.  Most recent flare-up was 4 weeks ago and pt went to ED.  Pt reports intense and sharp pain with movement at times and today her pain is 2-3/10.  She demonstrates full lumbar A/ROM with reduced segmental mobility in the lumbar spine.  LE strength is WFLs.  Pt with spasm and tension in Rt>Lt lumbar paraspinals and reduced and painful segmental mobility.  Patient will benefit from skilled PT to address the below impairments and improve overall function.   OBJECTIVE IMPAIRMENTS: decreased activity tolerance, hypomobility, increased muscle spasms, impaired flexibility, improper body mechanics, and pain.   ACTIVITY LIMITATIONS: standing, transfers, bed mobility, and caring for others  PARTICIPATION  LIMITATIONS: meal prep, cleaning, laundry, community activity, and occupation  PERSONAL FACTORS: 1 comorbidity: chronic LBP  are also affecting patient's functional outcome.   REHAB POTENTIAL: Good  CLINICAL DECISION MAKING: Stable/uncomplicated  EVALUATION COMPLEXITY: Moderate   GOALS: Goals reviewed with patient? Yes  SHORT TERM GOALS: Target date: 05/08/2023    Be independent in initial HEP Baseline: Goal status: INITIAL  2.  Report > or = to 30% reduction in the frequency of lumbar spasms with daily tasks  Baseline:  Goal status: INITIAL  3.  Verbalize compliance with TA contraction with transitional movements to reduce spasm Baseline:  Goal status: INITIAL   LONG TERM GOALS: Target date: 06/08/23  Be independent in advanced HEP Baseline:  Goal status: INITIAL  2.  Improve FOTO to > or = to 67 Baseline: 58 Goal status: INITIAL  3.  Verbalize and demonstrate body mechanics modifications for lumbar protection with daily tasks  Baseline:  Goal status: INITIAL  4.  Report > or = to 70% reduction in low back pain and spasms with daily tasks  Baseline:  Goal status: INITIAL   PLAN:  PT FREQUENCY: 1-2x/week  PT DURATION: 8 weeks  PLANNED INTERVENTIONS: Therapeutic exercises, Therapeutic activity, Neuromuscular re-education, Balance training, Gait training, Patient/Family education, Self Care, Joint mobilization, Aquatic Therapy, Dry Needling, Electrical stimulation, Spinal manipulation, Spinal mobilization, Cryotherapy, Moist heat, Traction, Ultrasound, Manual therapy, and Re-evaluation.  PLAN FOR NEXT SESSION: DN to lumbar multifidi and gluteals, review HEP, add core and gluteal strength    Lorrene Reid, PT 04/10/23 9:37 AM   [facility] [address], [address] [place], [place] [address] Phone # [phone] Fax [phone]

## 2023-04-10 ENCOUNTER — Other Ambulatory Visit: Payer: Self-pay

## 2023-04-10 ENCOUNTER — Ambulatory Visit: Payer: BC Managed Care – PPO | Attending: Neurosurgery

## 2023-04-10 DIAGNOSIS — R252 Cramp and spasm: Secondary | ICD-10-CM | POA: Diagnosis present

## 2023-04-10 DIAGNOSIS — M5459 Other low back pain: Secondary | ICD-10-CM | POA: Insufficient documentation

## 2023-04-19 NOTE — Therapy (Signed)
OUTPATIENT PHYSICAL THERAPY THORACOLUMBAR EVALUATION   Patient Name: Samantha Webster MRN: [id number] DOB:1986/09/16, 37 y.o., female Today's Date: 04/20/2023  END OF SESSION:  PT End of Session - 04/20/23 0947     Visit Number 2    Date for PT Re-Evaluation 06/08/23    Authorization Type BCBS    PT Start Time 0800    PT Stop Time 0845    PT Time Calculation (min) 45 min    Activity Tolerance Patient tolerated treatment well    Behavior During Therapy WFL for tasks assessed/performed              Past Medical History:  Diagnosis Date   Family history of breast cancer    History reviewed. No pertinent surgical history. Patient Active Problem List   Diagnosis Date Noted   Genetic testing 02/05/2018   Family history of breast cancer    Family hx-breast malignancy 03/25/2013   Sciatica of right side 06/21/2012   Right groin pain 06/21/2012   Recurrent low back pain 06/21/2012   NEOPLASM, SKIN, UNCERTAIN BEHAVIOR 06/12/2008   ACNE, CYSTIC 06/12/2008   LYMPH NODE-ENLARGED 10/04/2007    PCP: Berniece Andreas, MD  REFERRING PROVIDER: Coletta Memos, MD  REFERRING DIAG: disc displacement, lumbar   Rationale for Evaluation and Treatment: Rehabilitation  THERAPY DIAG:  Other low back pain  Cramp and spasm  ONSET DATE: chronic with recent flare-up 4 weeks ago  SUBJECTIVE:                                                                                                                                                                                           SUBJECTIVE STATEMENT: Pt states that she continues to note stiffness in her thoracic spine. She relates a lot of it to her stress from work.   EVAL: Pt presents to PT with LBP.   Pt reports that she has a herniated disc that occurred in the early 2000s. Pt reports that she has had intermittent flare-ups of LBP that is debilitating at times.  Most recent flare-up was 4 weeks ago and she went to ED.  She reports that she  has had PT in the past and has not stayed consistent with the exercises.  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   MD note states that insurance company denied MRI.  PERTINENT HISTORY:  Chronic LBP  PAIN:  Are you having pain? Yes: NPRS scale: 2-3/10 Pain location: thoracic to lumbar spine Pain description: intense, stiffness, sharp Aggravating factors: movement, lifting, hinging at hips  Relieving factors: time, pain medication  PRECAUTIONS: None  WEIGHT BEARING RESTRICTIONS: No  FALLS:  Has patient fallen in last 6 months? No  LIVING ENVIRONMENT: Lives with: lives with their family Lives in: House/apartment   OCCUPATION: preschool teacher   PLOF: Independent, preschool teacher   PATIENT GOALS: reduce flare-ups of low back pain  NEXT MD VISIT: none   OBJECTIVE:   DIAGNOSTIC FINDINGS:  None recent  PATIENT SURVEYS:  04/10/23: FOTO 58, goal is 4  SCREENING FOR RED FLAGS: Bowel or bladder incontinence: No Spinal tumors: No Cauda equina syndrome: No Compression fracture: No Abdominal aneurysm: No  COGNITION: Overall cognitive status: Within functional limits for tasks assessed     SENSATION: WFL  MUSCLE LENGTH: WFLs in hips  POSTURE: forward head and flexed trunk   PALPATION: Tension in Rt>Lt lumbar paraspinals and reduced PA mobility L1-5, minor trigger points in bil gluteals   LUMBAR ROM:  Full lumbar A/ROM with limited lumbar segmental mobility in all directions.  LOWER EXTREMITY ROM:    WFLs bilaterally  LOWER EXTREMITY MMT:   5/5 knee and hip strength bil except hip extension 4+/5 bil  LUMBAR SPECIAL TESTS:  Slump test: Negative  GAIT: Distance walked: 50 Assistive device utilized: None Level of assistance: Complete Independence Comments: No gait abnormalities   TODAY'S TREATMENT:                                                                                                                               DATE:04/20/2023: Nustep lvl 5, 5 min with PT present for subjective Supine open books x10, bilat  LTR x20  PPT x20  Abdominal bracing x10, with 5 sec holds.  Trigger Point Dry-Needling  Treatment instructions: Expect mild to moderate muscle soreness. S/S of pneumothorax if dry needled over a lung field, and to seek immediate medical attention should they occur. Patient verbalized understanding of these instructions and education.  Patient Consent Given: Yes Education handout provided: No Muscles treated: Thoracic T5-T7 and lumbar L3-L4 paraspinals.  Electrical stimulation performed: Yes Parameters:  Freq 30hz , intensity: mod Ma Treatment response/outcome: Decreased tension noted Cat/cow x20  Prayer stretch 2x30 sec holds STM to lumbar paraspinals.  High velocity/ low amplitude    04/10/23 HEP established-see below     PATIENT EDUCATION:  Education details: Access Code: [email or web address], DN  Person educated: Patient Education method: Explanation, Demonstration, and Handouts Education comprehension: verbalized understanding and returned demonstration  HOME EXERCISE PROGRAM: Access Code: [email or web address] URL: [email or web address] Date: 04/10/2023 Prepared by: Tresa Endo  Exercises - Sidelying Open Book Thoracic Lumbar Rotation and Extension  - 2-3 x daily - 7 x weekly - 1 sets - 10 reps - Seated Piriformis Stretch with Trunk Bend  - 3 x daily - 7 x weekly - 1 sets - 3 reps - 20 hold - Seated Hamstring Stretch  - 3 x daily - 7 x weekly - 1 sets - 3 reps - 20 hold - Supine Transversus Abdominis Bracing - Hands on Stomach  -  1 x daily - 7 x weekly - 1 sets - 10 reps - 5 hold - Seated Transversus Abdominis Bracing  - 1 x daily - 7 x weekly - 1 sets - 10 reps - 5 hold  ASSESSMENT:  CLINICAL IMPRESSION: Patient presents to her first f/u appt with continued pain and stiffness in her thoracic spine. She states that she has been active with her HEP, but was confused with her  abdominal bracing. Reviewed HEP with good form and response. PT tolerated TPDN with e-stim today very well with a reduction in pain and stiffness noted following. She did not have any adverse effects following. Educated pt on mobility stretches and ROM for both lumbar and thoracic spine. Pending long term response to DN, plan to DN bilat UT's and rhomboids as well. Pt departs feeling good with no increase in pain and reported fatigue. Pt will continue to benefit from skilled PT to address continued deficits.   OBJECTIVE IMPAIRMENTS: decreased activity tolerance, hypomobility, increased muscle spasms, impaired flexibility, improper body mechanics, and pain.   ACTIVITY LIMITATIONS: standing, transfers, bed mobility, and caring for others  PARTICIPATION LIMITATIONS: meal prep, cleaning, laundry, community activity, and occupation  PERSONAL FACTORS: 1 comorbidity: chronic LBP  are also affecting patient's functional outcome.   REHAB POTENTIAL: Good  CLINICAL DECISION MAKING: Stable/uncomplicated  EVALUATION COMPLEXITY: Moderate   GOALS: Goals reviewed with patient? Yes  SHORT TERM GOALS: Target date: 05/08/2023    Be independent in initial HEP Baseline: Goal status: INITIAL  2.  Report > or = to 30% reduction in the frequency of lumbar spasms with daily tasks  Baseline:  Goal status: INITIAL  3.  Verbalize compliance with TA contraction with transitional movements to reduce spasm Baseline:  Goal status: INITIAL   LONG TERM GOALS: Target date: 06/08/23  Be independent in advanced HEP Baseline:  Goal status: INITIAL  2.  Improve FOTO to > or = to 67 Baseline: 58 Goal status: INITIAL  3.  Verbalize and demonstrate body mechanics modifications for lumbar protection with daily tasks  Baseline:  Goal status: INITIAL  4.  Report > or = to 70% reduction in low back pain and spasms with daily tasks  Baseline:  Goal status: INITIAL   PLAN:  PT FREQUENCY: 1-2x/week  PT  DURATION: 8 weeks  PLANNED INTERVENTIONS: Therapeutic exercises, Therapeutic activity, Neuromuscular re-education, Balance training, Gait training, Patient/Family education, Self Care, Joint mobilization, Aquatic Therapy, Dry Needling, Electrical stimulation, Spinal manipulation, Spinal mobilization, Cryotherapy, Moist heat, Traction, Ultrasound, Manual therapy, and Re-evaluation.  PLAN FOR NEXT SESSION: DN to lumbar multifidi and gluteals, review HEP, add core and gluteal strength   Royal Hawthorn PT, DPT 04/20/23  9:47 AM

## 2023-04-20 ENCOUNTER — Encounter: Payer: Self-pay | Admitting: Physical Therapy

## 2023-04-20 ENCOUNTER — Ambulatory Visit: Payer: BC Managed Care – PPO | Attending: Neurosurgery | Admitting: Physical Therapy

## 2023-04-20 DIAGNOSIS — R252 Cramp and spasm: Secondary | ICD-10-CM | POA: Diagnosis present

## 2023-04-20 DIAGNOSIS — M5459 Other low back pain: Secondary | ICD-10-CM | POA: Diagnosis present

## 2023-04-26 NOTE — Therapy (Deleted)
OUTPATIENT PHYSICAL THERAPY THORACOLUMBAR EVALUATION   Patient Name: Samantha Webster MRN: [id number] DOB:01/19/1986, 37 y.o., female Today's Date: 04/26/2023  END OF SESSION:     Past Medical History:  Diagnosis Date   Family history of breast cancer    No past surgical history on file. Patient Active Problem List   Diagnosis Date Noted   Genetic testing 02/05/2018   Family history of breast cancer    Family hx-breast malignancy 03/25/2013   Sciatica of right side 06/21/2012   Right groin pain 06/21/2012   Recurrent low back pain 06/21/2012   NEOPLASM, SKIN, UNCERTAIN BEHAVIOR 06/12/2008   ACNE, CYSTIC 06/12/2008   LYMPH NODE-ENLARGED 10/04/2007    PCP: Berniece Andreas, MD  REFERRING PROVIDER: Coletta Memos, MD  REFERRING DIAG: disc displacement, lumbar   Rationale for Evaluation and Treatment: Rehabilitation  THERAPY DIAG:  No diagnosis found.  ONSET DATE: chronic with recent flare-up 4 weeks ago  SUBJECTIVE:                                                                                                                                                                                           SUBJECTIVE STATEMENT: Pt states that she continues to note stiffness in her thoracic spine. She relates a lot of it to her stress from work.   EVAL: Pt presents to PT with LBP.   Pt reports that she has a herniated disc that occurred in the early 2000s. Pt reports that she has had intermittent flare-ups of LBP that is debilitating at times.  Most recent flare-up was 4 weeks ago and she went to ED.  She reports that she has had PT in the past and has not stayed consistent with the exercises.  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   MD note states that insurance company denied MRI.  PERTINENT HISTORY:  Chronic LBP  PAIN:  Are you having pain? Yes: NPRS scale: 2-3/10 Pain location: thoracic to lumbar spine Pain description:  intense, stiffness, sharp Aggravating factors: movement, lifting, hinging at hips  Relieving factors: time, pain medication  PRECAUTIONS: None  WEIGHT BEARING RESTRICTIONS: No  FALLS:  Has patient fallen in last 6 months? No  LIVING ENVIRONMENT: Lives with: lives with their family Lives in: House/apartment   OCCUPATION: preschool teacher   PLOF: Independent, preschool teacher   PATIENT GOALS: reduce flare-ups of low back pain  NEXT MD VISIT: none   OBJECTIVE:   DIAGNOSTIC FINDINGS:  None recent  PATIENT SURVEYS:  04/10/23: FOTO 58, goal is 67  SCREENING FOR RED FLAGS: Bowel or bladder incontinence:  No Spinal tumors: No Cauda equina syndrome: No Compression fracture: No Abdominal aneurysm: No  COGNITION: Overall cognitive status: Within functional limits for tasks assessed     SENSATION: WFL  MUSCLE LENGTH: WFLs in hips  POSTURE: forward head and flexed trunk   PALPATION: Tension in Rt>Lt lumbar paraspinals and reduced PA mobility L1-5, minor trigger points in bil gluteals   LUMBAR ROM:  Full lumbar A/ROM with limited lumbar segmental mobility in all directions.  LOWER EXTREMITY ROM:    WFLs bilaterally  LOWER EXTREMITY MMT:   5/5 knee and hip strength bil except hip extension 4+/5 bil  LUMBAR SPECIAL TESTS:  Slump test: Negative  GAIT: Distance walked: 50 Assistive device utilized: None Level of assistance: Complete Independence Comments: No gait abnormalities   TODAY'S TREATMENT:                                                                                                                              DATE:04/20/2023: Nustep lvl 5, 5 min with PT present for subjective Supine open books x10, bilat  LTR x20  PPT x20  Abdominal bracing x10, with 5 sec holds.  Trigger Point Dry-Needling  Treatment instructions: Expect mild to moderate muscle soreness. S/S of pneumothorax if dry needled over a lung field, and to seek immediate medical  attention should they occur. Patient verbalized understanding of these instructions and education.  Patient Consent Given: Yes Education handout provided: No Muscles treated: Thoracic T5-T7 and lumbar L3-L4 paraspinals.  Electrical stimulation performed: Yes Parameters:  Freq 30hz , intensity: mod Ma Treatment response/outcome: Decreased tension noted Cat/cow x20  Prayer stretch 2x30 sec holds STM to lumbar paraspinals.  High velocity/ low amplitude    04/10/23 HEP established-see below     PATIENT EDUCATION:  Education details: Access Code: [email or web address], DN  Person educated: Patient Education method: Explanation, Demonstration, and Handouts Education comprehension: verbalized understanding and returned demonstration  HOME EXERCISE PROGRAM: Access Code: [email or web address] URL: [email or web address] Date: 04/10/2023 Prepared by: Tresa Endo  Exercises - Sidelying Open Book Thoracic Lumbar Rotation and Extension  - 2-3 x daily - 7 x weekly - 1 sets - 10 reps - Seated Piriformis Stretch with Trunk Bend  - 3 x daily - 7 x weekly - 1 sets - 3 reps - 20 hold - Seated Hamstring Stretch  - 3 x daily - 7 x weekly - 1 sets - 3 reps - 20 hold - Supine Transversus Abdominis Bracing - Hands on Stomach  - 1 x daily - 7 x weekly - 1 sets - 10 reps - 5 hold - Seated Transversus Abdominis Bracing  - 1 x daily - 7 x weekly - 1 sets - 10 reps - 5 hold  ASSESSMENT:  CLINICAL IMPRESSION: Patient presents to her first f/u appt with continued pain and stiffness in her thoracic spine. She states that she has been active with her HEP, but was confused with her abdominal bracing. Reviewed HEP with good form and  response. PT tolerated TPDN with e-stim today very well with a reduction in pain and stiffness noted following. She did not have any adverse effects following. Educated pt on mobility stretches and ROM for both lumbar and thoracic spine. Pending long term response to DN, plan to DN bilat UT's and  rhomboids as well. Pt departs feeling good with no increase in pain and reported fatigue. Pt will continue to benefit from skilled PT to address continued deficits.   OBJECTIVE IMPAIRMENTS: decreased activity tolerance, hypomobility, increased muscle spasms, impaired flexibility, improper body mechanics, and pain.   ACTIVITY LIMITATIONS: standing, transfers, bed mobility, and caring for others  PARTICIPATION LIMITATIONS: meal prep, cleaning, laundry, community activity, and occupation  PERSONAL FACTORS: 1 comorbidity: chronic LBP  are also affecting patient's functional outcome.   REHAB POTENTIAL: Good  CLINICAL DECISION MAKING: Stable/uncomplicated  EVALUATION COMPLEXITY: Moderate   GOALS: Goals reviewed with patient? Yes  SHORT TERM GOALS: Target date: 05/08/2023    Be independent in initial HEP Baseline: Goal status: INITIAL  2.  Report > or = to 30% reduction in the frequency of lumbar spasms with daily tasks  Baseline:  Goal status: INITIAL  3.  Verbalize compliance with TA contraction with transitional movements to reduce spasm Baseline:  Goal status: INITIAL   LONG TERM GOALS: Target date: 06/08/23  Be independent in advanced HEP Baseline:  Goal status: INITIAL  2.  Improve FOTO to > or = to 67 Baseline: 58 Goal status: INITIAL  3.  Verbalize and demonstrate body mechanics modifications for lumbar protection with daily tasks  Baseline:  Goal status: INITIAL  4.  Report > or = to 70% reduction in low back pain and spasms with daily tasks  Baseline:  Goal status: INITIAL   PLAN:  PT FREQUENCY: 1-2x/week  PT DURATION: 8 weeks  PLANNED INTERVENTIONS: Therapeutic exercises, Therapeutic activity, Neuromuscular re-education, Balance training, Gait training, Patient/Family education, Self Care, Joint mobilization, Aquatic Therapy, Dry Needling, Electrical stimulation, Spinal manipulation, Spinal mobilization, Cryotherapy, Moist heat, Traction, Ultrasound,  Manual therapy, and Re-evaluation.  PLAN FOR NEXT SESSION: DN to lumbar multifidi and gluteals, review HEP, add core and gluteal strength   Royal Hawthorn PT, DPT 04/26/23  2:47 PM

## 2023-04-27 ENCOUNTER — Ambulatory Visit: Payer: BC Managed Care – PPO | Admitting: Physical Therapy

## 2023-05-04 ENCOUNTER — Encounter: Payer: BC Managed Care – PPO | Admitting: Physical Therapy

## 2023-05-11 ENCOUNTER — Encounter: Payer: BC Managed Care – PPO | Admitting: Physical Therapy

## 2023-05-18 ENCOUNTER — Ambulatory Visit: Payer: BC Managed Care – PPO | Admitting: Physical Therapy

## 2023-05-18 DIAGNOSIS — R252 Cramp and spasm: Secondary | ICD-10-CM

## 2023-05-18 DIAGNOSIS — M5459 Other low back pain: Secondary | ICD-10-CM | POA: Diagnosis not present

## 2023-05-18 NOTE — Therapy (Addendum)
 OUTPATIENT PHYSICAL THERAPY THORACOLUMBAR PROGRESS NOTE   Patient Name: Samantha Webster MRN: [id number] DOB:[birth year], 37 y.o., female Today's Date: 05/18/2023  END OF SESSION:  PT End of Session - 05/18/23 0800     Visit Number 3    Date for PT Re-Evaluation 06/08/23    Authorization Type BCBS    PT Start Time 0800    PT Stop Time 0840    PT Time Calculation (min) 40 min    Activity Tolerance Patient tolerated treatment well              Past Medical History:  Diagnosis Date   Family history of breast cancer    No past surgical history on file. Patient Active Problem List   Diagnosis Date Noted   Genetic testing 02/05/2018   Family history of breast cancer    Family hx-breast malignancy 03/25/2013   Sciatica of right side 06/21/2012   Right groin pain 06/21/2012   Recurrent low back pain 06/21/2012   NEOPLASM, SKIN, UNCERTAIN BEHAVIOR 06/12/2008   ACNE, CYSTIC 06/12/2008   LYMPH NODE-ENLARGED 10/04/2007    PCP: Berniece Andreas, MD  REFERRING PROVIDER: Coletta Memos, MD  REFERRING DIAG: disc displacement, lumbar   Rationale for Evaluation and Treatment: Rehabilitation  THERAPY DIAG:  Other low back pain  Cramp and spasm  ONSET DATE: chronic with recent flare-up 4 weeks ago  SUBJECTIVE:                                                                                                                                                                                           SUBJECTIVE STATEMENT:    Yesterday new onset difficulty twisting. Paraspinals  feel tight.  Nauseous right after DN.  Felt weak afterwards too like I needed support to my upper back.  A little achey.  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  Patient reports she has cancelled some visits secondary to financial reasons.     EVAL: Pt presents to PT with LBP.   Pt reports that she has a herniated disc that occurred in the early 2000s. Pt reports that she has had  intermittent flare-ups of LBP that is debilitating at times.  Most recent flare-up was 4 weeks ago and she went to ED.  She reports that she has had PT in the past and has not stayed consistent with the exercises.  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   MD note states that insurance company denied MRI.  PERTINENT HISTORY:  Chronic LBP  PAIN:  Are you having pain? Yes:  NPRS scale: 5-6/10 Pain location: mid thoracic to lumbar spine Pain description: intense, stiffness, sharp Aggravating factors: movement, lifting, hinging at hips  Relieving factors: time, pain medication  PRECAUTIONS: None  WEIGHT BEARING RESTRICTIONS: No  FALLS:  Has patient fallen in last 6 months? 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   PLOF: Independent   PATIENT GOALS: reduce flare-ups of low back pain  NEXT MD VISIT: none   OBJECTIVE:   DIAGNOSTIC FINDINGS:  None recent  PATIENT SURVEYS:  04/10/23: FOTO 58, goal is 67  SCREENING FOR RED FLAGS: Bowel or bladder incontinence: No Spinal tumors: No Cauda equina syndrome: No Compression fracture: No Abdominal aneurysm: No  COGNITION: Overall cognitive status: Within functional limits for tasks assessed     SENSATION: WFL  MUSCLE LENGTH: WFLs in hips  POSTURE: forward head and flexed trunk   PALPATION: Tension in Rt>Lt lumbar paraspinals and reduced PA mobility L1-5, minor trigger points in bil gluteals   LUMBAR ROM:  Full lumbar A/ROM with limited lumbar segmental mobility in all directions.  LOWER EXTREMITY ROM:    WFLs bilaterally  LOWER EXTREMITY MMT:   5/5 knee and hip strength bil except hip extension 4+/5 bil  LUMBAR SPECIAL TESTS:  Slump test: Negative  GAIT: Distance walked: 50 Assistive device utilized: None Level of assistance: Complete Independence Comments: No  gait abnormalities   TODAY'S TREATMENT:      DATE:05/18/2023: Review of status and response to last treatment History of high flexibility: discussed current tightness could be masking core weakness Abdominal bracing x10, with 5 sec holds Ab bracing with bent knee lift/lowers 5x Ab bracing with bent knee lifts with straight single leg lowers 5x Modified plank 5 sec hold 5x Bird dogs with UE/LE raises 5x verbal and tactile cues for technique Seated or standing band ex's for start of  new desk work job this week Seated thoracic extension with ball for desk work  Manual therapy to bil lumbar paraspinals and thoracic paraspinals Trigger Point Dry-Needling  Treatment instructions: Expect mild to moderate muscle soreness. S/S of pneumothorax if dry needled over a lung field, and to seek immediate medical attention should they occur. Patient verbalized understanding of these instructions and education.  Patient Consent Given: Yes Education handout provided: No Muscles treated: Thoracic paraspinals fanning and lumbar deep multifidi Electrical stimulation performed: no Parameters: NA Treatment response/outcome: Decreased tension noted                                                                                                                           DATE:04/20/2023: Nustep lvl 5, 5 min with PT present for subjective Supine open books x10, bilat  LTR x20  PPT x20  Abdominal bracing x10, with 5 sec holds.  Trigger Point Dry-Needling  Treatment instructions: Expect mild to moderate muscle soreness. S/S of pneumothorax if dry needled over a lung field, and  to seek immediate medical attention should they occur. Patient verbalized understanding of these instructions and education.  Patient Consent Given: Yes Education handout provided: No Muscles treated: Thoracic T5-T7 and lumbar L3-L4 paraspinals.  Electrical stimulation performed: Yes Parameters:  Freq 30hz , intensity: mod Ma Treatment  response/outcome: Decreased tension noted Cat/cow x20  Prayer stretch 2x30 sec holds STM to lumbar paraspinals.  High velocity/ low amplitude   PATIENT EDUCATION:  Education details: Access Code: [email or web address]  Person educated: Patient Education method: Explanation, Demonstration, and Handouts Education comprehension: verbalized understanding and returned demonstration  HOME EXERCISE PROGRAM: Access Code: [email or web address] URL: [email or web address] Date: 04/10/2023 Prepared by: Tresa Endo  Exercises - Sidelying Open Book Thoracic Lumbar Rotation and Extension  - 2-3 x daily - 7 x weekly - 1 sets - 10 reps - Seated Piriformis Stretch with Trunk Bend  - 3 x daily - 7 x weekly - 1 sets - 3 reps - 20 hold - Seated Hamstring Stretch  - 3 x daily - 7 x weekly - 1 sets - 3 reps - 20 hold - Supine Transversus Abdominis Bracing - Hands on Stomach  - 1 x daily - 7 x weekly - 1 sets - 10 reps - 5 hold - Seated Transversus Abdominis Bracing  - 1 x daily - 7 x weekly - 1 sets - 10 reps - 5 hold  ASSESSMENT:  CLINICAL IMPRESSION: Progression of core stabilization and addition of exercises for work day (new job sitting).  Therapist providing verbal cues to optimize technique with  exercises in order to achieve the greatest benefit.  Although she had nausea and weakness post DN last visit she is receptive to trying again.  Limited amount of DN performed to test response.  No symptoms immediately following.  Therapist monitoring response to all interventions and modifying treatment accordingly.      OBJECTIVE IMPAIRMENTS: decreased activity tolerance, hypomobility, increased muscle spasms, impaired flexibility, improper body mechanics, and pain.   ACTIVITY LIMITATIONS: standing, transfers, bed mobility, and caring for others  PARTICIPATION LIMITATIONS: meal prep, cleaning, laundry, community activity, and occupation  PERSONAL FACTORS: 1 comorbidity: chronic LBP  are also affecting patient's  functional outcome.   REHAB POTENTIAL: Good  CLINICAL DECISION MAKING: Stable/uncomplicated  EVALUATION COMPLEXITY: Moderate   GOALS: Goals reviewed with patient? Yes  SHORT TERM GOALS: Target date: 05/08/2023    Be independent in initial HEP Baseline: Goal status: met 5/31  2.  Report > or = to 30% reduction in the frequency of lumbar spasms with daily tasks  Baseline:  Goal status: INITIAL  3.  Verbalize compliance with TA contraction with transitional movements to reduce spasm Baseline:  Goal status: met 5/31   LONG TERM GOALS: Target date: 06/08/23  Be independent in advanced HEP Baseline:  Goal status: INITIAL  2.  Improve FOTO to > or = to 67 Baseline: 58 Goal status: INITIAL  3.  Verbalize and demonstrate body mechanics modifications for lumbar protection with daily tasks  Baseline:  Goal status: INITIAL  4.  Report > or = to 70% reduction in low back pain and spasms with daily tasks  Baseline:  Goal status: INITIAL   PLAN:  PT FREQUENCY: 1-2x/week  PT DURATION: 8 weeks  PLANNED INTERVENTIONS: Therapeutic exercises, Therapeutic activity, Neuromuscular re-education, Balance training, Gait training, Patient/Family education, Self Care, Joint mobilization, Aquatic Therapy, Dry Needling, Electrical stimulation, Spinal manipulation, Spinal mobilization, Cryotherapy, Moist heat, Traction, Ultrasound, Manual therapy, and Re-evaluation.  PLAN FOR NEXT SESSION: assess response to DN to lumbar  multifidi, thoracic paraspinals, review HEP, core progression and gluteal strength   Lavinia Sharps, PT 05/18/23 12:04 PM Phone: [phone] Fax: [phone]  PHYSICAL THERAPY DISCHARGE SUMMARY Visits: 3   As of 03/03/24, patient has not returned for further visits and discharged from PT at this time.  Please see above for most recent PT update.  Patient agrees to discharge. Patient goals were partially met. Patient is being discharged due to not returning since  the last visit.  Lorrene Reid, PT 03/03/24, 10:47 AM

## 2023-05-25 ENCOUNTER — Ambulatory Visit: Payer: BC Managed Care – PPO | Admitting: Rehabilitative and Restorative Service Providers"

## 2023-06-08 ENCOUNTER — Encounter: Payer: BC Managed Care – PPO | Admitting: Rehabilitative and Restorative Service Providers"

## 2023-07-13 DIAGNOSIS — R739 Hyperglycemia, unspecified: Secondary | ICD-10-CM | POA: Diagnosis not present

## 2023-08-13 DIAGNOSIS — H52223 Regular astigmatism, bilateral: Secondary | ICD-10-CM | POA: Diagnosis not present

## 2023-10-23 ENCOUNTER — Encounter: Payer: Commercial Managed Care - PPO | Attending: Internal Medicine | Admitting: Dietician

## 2023-10-23 ENCOUNTER — Encounter: Payer: Self-pay | Admitting: Dietician

## 2023-10-23 VITALS — Ht 64.0 in | Wt 189.0 lb

## 2023-10-23 DIAGNOSIS — Z6832 Body mass index (BMI) 32.0-32.9, adult: Secondary | ICD-10-CM | POA: Insufficient documentation

## 2023-10-23 DIAGNOSIS — E66811 Obesity, class 1: Secondary | ICD-10-CM | POA: Insufficient documentation

## 2023-10-23 NOTE — Progress Notes (Signed)
Medical Nutrition Therapy  Appointment Start time:  0800  Appointment End time:  0900  Referral diagnosis: Employee Visit 1, no referral diagnosis Preferred learning style: no preference indicated Learning readiness: ready   NUTRITION ASSESSMENT   Anthropometrics  Ht: 64 in Wt: 189 lb  Clinical Medical Hx: reviewed Medications: reviewed Labs: reviewed Notable Signs/Symptoms: occasional constipation/diarrhea, edema, occasional flushing on neck, bumps on elbows (pt states she thinks this is eczema) Food Allergies: none (took an allergy blood test that stated eggs and chicken but pt states no issues with these foods)  Lifestyle & Dietary Hx  Pt states over last 2 years she has been feeling different and states she has slowly been putting on weight. She states she was encouraged to reach out to a dietitian.   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   Pt reports she takes trazadone for sleep and most nights also takes benadryl.   Pt states she started working out again in October after taking about a year off. Pt states she has been in and out of the gym for 20 years, and reports [facility] and weightlifting class once per week. Pt reports she has a herniated disc that effects her movement sometimes. Pt states on nights she has spin class she gets home around 7:15 and may skip dinner.   Pt states she notices her ankles have been hurting while walking and notices swelling in her feet/calves at the end of the day.   Pt lives with husband & 61 year old child.   Pt reports bowel can be inconsistent. She states sometimes they are normal, but sometimes she may go 2-3 days without and then have diarrhea.   Estimated daily fluid intake: 40-60 oz Supplements: vitamin D Sleep: 9pm-5am, some nights awake 1am-3am.  Stress / self-care: low-to-moderate  stress,  Current average weekly physical activity: cycle class T/Th 6-7pm, weightlifting Saturday  24-Hr Dietary Recall First Meal: 8:30am: premier protein shake OR greek yogurt with 1/2 apple, peanut butter, drizzle honey and granola Snack: none Second Meal: leftovers: protein, vegetable and rice OR reps bring food 3x/wk (panera, [place] food, habit burger) Snack: none Third Meal: protein (chicken, fish, red meat) and vegetable and starch OR takeout 1x/wk Snack: none Beverages: water, la croix, coffee, 2 glasses wine most nights   NUTRITION DIAGNOSIS  NB-1.1 Food and nutrition-related knowledge deficit As related to lack of prior nutrition education by a nutrition professional.  As evidenced by pt report.   NUTRITION INTERVENTION  Nutrition education (E-1) on the following topics:   Plate Method Fruits & Vegetables: Aim to fill half your plate with a variety of fruits and vegetables. They are rich in vitamins, minerals, and fiber, and can help reduce the risk of chronic diseases. Choose a colorful assortment of fruits and vegetables to ensure you get a wide range of nutrients. Grains and Starches: Make at least half of your grain choices whole grains, such as brown rice, whole wheat bread, and oats. Whole grains provide fiber, which aids in digestion and healthy cholesterol levels. Aim for whole forms of starchy vegetables such as potatoes, sweet potatoes, beans, peas, and corn, which are fiber rich and provide many vitamins and minerals.  Protein: Incorporate lean sources of protein, such as poultry, fish, beans, nuts, and seeds, into your meals. Protein is essential for  building and repairing tissues, staying full, balancing blood sugar, as well as supporting immune function. Dairy: Include low-fat or fat-free dairy products like milk, yogurt, and cheese in your diet. Dairy foods are excellent sources of calcium and vitamin D, which are crucial for bone health.   Fiber Dietary fiber is  essential for health and comes in two types: soluble and insoluble fiber. Soluble Fiber: Characteristics: Dissolves in water, forming a gel-like substance. Sources: Oats, nuts, seeds, beans, lentils, fruits (apples, citrus), and vegetables (carrots). Benefits: Regulates blood sugar, lowers LDL cholesterol, supports heart health, and aids in digestion by forming a gel that prevents diarrhea. Insoluble Fiber: Characteristics: Does not dissolve in water and adds bulk to stool. Sources: Whole grains, bran, nuts, seeds, vegetables (cauliflower, green beans), and fruits (apples with skin, berries). Benefits: Promotes regular bowel movements, aids in weight management, and prevents diverticular disease.  Physical Activity Aim for 150 minutes of physical activity weekly. Make physical activity a part of your week. Try to include at least 30 minutes of physical activity 5 days each week or at least 150 minutes per week. Regular physical activity promotes overall health-including helping to reduce risk for heart disease and diabetes, promoting mental health, and helping [place] sleep better.      Handouts Provided Include  Plate Method  Learning Style & Readiness for Change Teaching method utilized: Visual & Auditory  Demonstrated degree of understanding via: Teach Back  Barriers to learning/adherence to lifestyle change: none  Goals Established by Pt  Goal: Aim to drink 1 1/2 of your [place] of water daily (60 oz).  Goal: Aim to have a tea or la croix in the evening instead of wine at least 3 nights per week.  Goal: Aim to make 1/2 of my plate non-starchy vegetables at least 1 time per day.    MONITORING & EVALUATION Dietary intake, weekly physical activity, and follow up in 2 months.  Next Steps  Patient is to call for questions.

## 2023-10-23 NOTE — Patient Instructions (Signed)
Goal: Aim to drink 1 1/2 of your [place] of water daily (60 oz).  Goal: Aim to have a tea or la croix in the evening instead of wine at least 3 nights per week.  Goal: Aim to make 1/2 of my plate non-starchy vegetables at least 1 time per day.

## 2023-11-28 DIAGNOSIS — F411 Generalized anxiety disorder: Secondary | ICD-10-CM | POA: Diagnosis not present

## 2023-11-28 DIAGNOSIS — F900 Attention-deficit hyperactivity disorder, predominantly inattentive type: Secondary | ICD-10-CM | POA: Diagnosis not present

## 2023-11-28 DIAGNOSIS — Z5181 Encounter for therapeutic drug level monitoring: Secondary | ICD-10-CM | POA: Diagnosis not present

## 2023-11-28 DIAGNOSIS — F330 Major depressive disorder, recurrent, mild: Secondary | ICD-10-CM | POA: Diagnosis not present

## 2023-12-28 ENCOUNTER — Ambulatory Visit: Payer: Commercial Managed Care - PPO | Admitting: Dietician

## 2024-01-21 DIAGNOSIS — R739 Hyperglycemia, unspecified: Secondary | ICD-10-CM | POA: Diagnosis not present

## 2024-01-21 DIAGNOSIS — R7309 Other abnormal glucose: Secondary | ICD-10-CM | POA: Diagnosis not present

## 2024-02-15 DIAGNOSIS — F330 Major depressive disorder, recurrent, mild: Secondary | ICD-10-CM | POA: Diagnosis not present

## 2024-02-15 DIAGNOSIS — F411 Generalized anxiety disorder: Secondary | ICD-10-CM | POA: Diagnosis not present

## 2024-02-15 DIAGNOSIS — F900 Attention-deficit hyperactivity disorder, predominantly inattentive type: Secondary | ICD-10-CM | POA: Diagnosis not present

## 2024-04-10 ENCOUNTER — Other Ambulatory Visit: Payer: Self-pay

## 2024-04-14 ENCOUNTER — Other Ambulatory Visit: Payer: Self-pay

## 2024-04-14 MED ORDER — LAMOTRIGINE 25 MG PO TABS: 50.0000 mg | ORAL_TABLET | Freq: Every day | ORAL | 0 refills | Status: DC

## 2024-04-14 MED ORDER — SERTRALINE HCL 25 MG PO TABS: 25.0000 mg | ORAL_TABLET | Freq: Every morning | ORAL | 1 refills | Status: DC

## 2024-04-14 MED ORDER — TRAZODONE HCL 50 MG PO TABS
150.0000 mg | ORAL_TABLET | Freq: Every evening | ORAL | 5 refills | Status: DC
  Filled 2024-05-08: qty 270, 90d supply, fill #0
  Filled 2024-08-11: qty 270, 90d supply, fill #1

## 2024-04-14 MED ORDER — VILAZODONE HCL 40 MG PO TABS: 40.0000 mg | ORAL_TABLET | Freq: Every day | ORAL | 1 refills | Status: DC

## 2024-04-14 MED ORDER — SERTRALINE HCL 50 MG PO TABS: 50.0000 mg | ORAL_TABLET | Freq: Every morning | ORAL | 0 refills | Status: DC

## 2024-04-28 DIAGNOSIS — Z124 Encounter for screening for malignant neoplasm of cervix: Secondary | ICD-10-CM | POA: Diagnosis not present

## 2024-04-28 DIAGNOSIS — Z1151 Encounter for screening for human papillomavirus (HPV): Secondary | ICD-10-CM | POA: Diagnosis not present

## 2024-04-28 DIAGNOSIS — Z1231 Encounter for screening mammogram for malignant neoplasm of breast: Secondary | ICD-10-CM | POA: Diagnosis not present

## 2024-04-28 DIAGNOSIS — Z6829 Body mass index (BMI) 29.0-29.9, adult: Secondary | ICD-10-CM | POA: Diagnosis not present

## 2024-04-28 DIAGNOSIS — Z01419 Encounter for gynecological examination (general) (routine) without abnormal findings: Secondary | ICD-10-CM | POA: Diagnosis not present

## 2024-05-05 ENCOUNTER — Encounter: Payer: Self-pay | Admitting: Emergency Medicine

## 2024-05-05 ENCOUNTER — Ambulatory Visit: Admission: EM | Admit: 2024-05-05 | Discharge: 2024-05-05 | Disposition: A | Discharge status: Home / Self Care

## 2024-05-05 ENCOUNTER — Other Ambulatory Visit: Payer: Self-pay

## 2024-05-05 DIAGNOSIS — R1031 Right lower quadrant pain: Secondary | ICD-10-CM

## 2024-05-05 MED ORDER — AZITHROMYCIN 250 MG PO TABS
ORAL_TABLET | ORAL | 0 refills | Status: AC
  Filled 2024-05-05: qty 6, 5d supply, fill #0

## 2024-05-05 NOTE — ED Triage Notes (Signed)
 Last Tuesday when working out started having RLQ pains/"cramp". Last night had severe pains. This morning "noticed white in my stool". Reports stool been "looser side but more formed today".

## 2024-05-05 NOTE — ED Provider Notes (Signed)
 Samantha Webster    CSN: [id number] Arrival date & time: 05/05/24  1638      History   Chief Complaint Chief Complaint  Patient presents with   Abdominal Pain    HPI Samantha Webster is a 38 y.o. female.   Patient presents for evaluation of intermittent right lower quadrant abdominal pain beginning 6 days ago.  Endorses Samantha Webster material in the stool today, first occurrence.  When flushing the toilet water became cloudy in appearance.  Abdominal pain initially began while in a cycling class, felt like a cramp, has become dull and aching pain but overnight was intense and sharp awakening her from sleep.  Able to tolerate food and liquids.  Denies nausea, vomiting, hematochezia, diarrhea, constipation.  Currently menstruating, no change from baseline.  History of uterine fibroids, surgically removed when to 2 years ago.  Denies fever or URI symptoms.  Has not attempted treatment.  Past Medical History:  Diagnosis Date   Family history of breast cancer     Patient Active Problem List   Diagnosis Date Noted   Genetic testing 02/05/2018   Family history of breast cancer    Family hx-breast malignancy 03/25/2013   Sciatica of right side 06/21/2012   Right groin pain 06/21/2012   Recurrent low back pain 06/21/2012   NEOPLASM, SKIN, UNCERTAIN BEHAVIOR 06/12/2008   ACNE, CYSTIC 06/12/2008   LYMPH NODE-ENLARGED 10/04/2007    History reviewed. No pertinent surgical history.  OB History   No obstetric history on file.      Home Medications    Prior to Admission medications   Medication Sig Start Date End Date Taking? Authorizing Provider  azithromycin  (ZITHROMAX ) 250 MG tablet Take 2 tablets (500 mg total) by mouth daily for 1 day, THEN 1 tablet (250 mg total) daily for 4 days. 05/05/24 05/10/24 Yes Alexx Mcburney, Maybelle Spatz, NP  dexmethylphenidate (FOCALIN XR) 15 MG 24 hr capsule Take 15 mg by mouth every morning. 04/09/24  Yes [provider]  ALPRAZolam  (XANAX ) 1 MG tablet  Take 1 tablet (1 mg total) by mouth at bedtime as needed for anxiety (avoid regular use). Patient not taking: Reported on 04/10/2023 07/13/20   Panosh, Wanda K, MD  Cholecalciferol (VITAMIN D) 50 MCG (2000 UT) tablet Take 2,000 Units by mouth daily.    [provider]  cyclobenzaprine  (FLEXERIL ) 10 MG tablet Take 1 tablet (10 mg total) by mouth 2 (two) times daily as needed for muscle spasms. Patient not taking: Reported on 04/10/2023 03/21/23   Robinson, John K, PA-C  diphenhydrAMINE HCl, Sleep, 50 MG CAPS Unisom (diphenhydramine) Patient not taking: Reported on 04/10/2023    [provider]  lidocaine  (LIDODERM ) 5 % Place 1 patch onto the skin daily. Remove & Discard patch within 12 hours or as directed by MD Patient not taking: Reported on 04/10/2023 03/21/23   Robinson, John K, PA-C  Magnesium 100 MG CAPS Take 2 capsules by mouth daily. Patient not taking: Reported on 04/10/2023    [provider]  metaxalone (SKELAXIN) 800 MG tablet Take 800 mg by mouth as needed.  Patient not taking: Reported on 04/10/2023 12/13/19   [provider]  methylPREDNISolone  (MEDROL  DOSEPAK) 4 MG TBPK tablet As directed Patient not taking: Reported on 04/10/2023 05/20/21   Donley Furth, MD  omeprazole  (PRILOSEC) 40 MG capsule Take 1 capsule by mouth daily. **Please schedule appointment for further refills.** Patient not taking: Reported on 04/10/2023 10/20/21   Panosh, Wanda K, MD  traMADol (ULTRAM)  50 MG tablet Take 50 mg by mouth every 8 (eight) hours as needed. Patient not taking: Reported on 04/10/2023 12/13/19   [provider]  traZODone  (DESYREL ) 50 MG tablet TAKE 1 TABLET (50 MG TOTAL) BY MOUTH AT BEDTIME. CAN INCREASE TO 75 MG THEN 100 MG DOSE AS NEEDED Patient not taking: Reported on 04/10/2023 05/14/20   Panosh, Joaquim Muir, MD  traZODone  (DESYREL ) 50 MG tablet Take 3 tablets (150 mg total) by mouth Nightly for sleep. 03/08/24     Turmeric (QC TUMERIC COMPLEX PO) Take 450  tablets by mouth daily. Patient not taking: Reported on 04/10/2023    [provider]  Vilazodone  HCl (VIIBRYD ) 40 MG TABS Take 1 tablet (40 mg total) by mouth daily. 05/06/23     sertraline  (ZOLOFT ) 50 MG tablet Take 1 tablet (50 mg total) by mouth in the morning. 02/22/24 04/14/24      Family History Family History  Problem Relation Age of Onset   Breast cancer Mother 86       deceased early onset.    Breast cancer Maternal Grandmother 21       CHEK2 pos   Skin cancer Maternal Grandfather    Scleroderma Paternal Grandmother    Breast cancer Other        paternal great aunt dx over 76   Breast cancer Other        MGM's mother   Prostate cancer Other        MGMs father    Social History Social History   Tobacco Use   Smoking status: Never   Smokeless tobacco: Never  Vaping Use   Vaping status: Never Used  Substance Use Topics   Alcohol use: Yes    Alcohol/week: 7.0 standard drinks of alcohol    Types: 7 Glasses of wine per week   Drug use: No     Allergies   Benzyl alcohol and Penicillins   Review of Systems Review of Systems  Gastrointestinal:  Positive for abdominal pain.     Physical Exam Triage Vital Signs ED Triage Vitals  Encounter Vitals Group     BP 05/05/24 1702 109/76     Systolic BP Percentile --      Diastolic BP Percentile --      Pulse Rate 05/05/24 1702 60     Resp 05/05/24 1702 17     Temp 05/05/24 1702 98.7 F (37.1 C)     Temp Source 05/05/24 1702 Oral     SpO2 05/05/24 1702 97 %     Weight --      Height --      Head Circumference --      Peak Flow --      Pain Score 05/05/24 1700 2     Pain Loc --      Pain Education --      Exclude from Growth Chart --    No data found.  Updated Vital Signs BP 109/76 (BP Location: Left Arm)   Pulse 60   Temp 98.7 F (37.1 C) (Oral)   Resp 17   LMP 04/30/2024   SpO2 97%   Visual Acuity Right Eye Distance:   Left Eye Distance:   Bilateral Distance:    Right Eye Near:   Left  Eye Near:    Bilateral Near:     Physical Exam Constitutional:      Appearance: Normal appearance.  Eyes:     Extraocular Movements: Extraocular movements intact.  Pulmonary:  Effort: Pulmonary effort is normal.  Abdominal:     General: Abdomen is flat. Bowel sounds are normal.     Palpations: Abdomen is soft.     Tenderness: There is abdominal tenderness in the right lower quadrant.  Neurological:     Mental Status: She is alert and oriented to person, place, and time. Mental status is at baseline.      UC Treatments / Results  Labs (all labs ordered are listed, but only abnormal results are displayed) Labs Reviewed - No data to display  EKG   Radiology No results found.  Procedures Procedures (including critical care time)  Medications Ordered in UC Medications - No data to display  Initial Impression / Assessment and Plan / UC Course  I have reviewed the triage vital signs and the nursing notes.  Pertinent labs & imaging results that were available during my care of the patient were reviewed by me and considered in my medical decision making (see chart for details).  Right lower quadrant abdominal pain  Vital signs are stable, patient in no signs of distress nontoxic-appearing, able to sit comfortably within exam room, mild tenderness noted to the right lower quadrant, no guarding present on exam, symptoms present for 7 days low suspicion for an acutely inflamed organ, empirically placed on azithromycin , patient to return to clinic with stool sample for testing, abdominal ultrasound to be completed tomorrow as imaging unavailable today, discussed for any symptoms worsening severity to go to the nearest emergency department, verbalized understanding for persisting but not worsening symptoms patient to follow-up with gastrointestinal as well as gynecology Final Clinical Impressions(s) / UC Diagnoses   Final diagnoses:  RLQ abdominal pain   Discharge Instructions       You waited for your abdominal pain, unknown cause at this time  Low suspicion for urinary infection or kidney infection as you do not have any urinary symptoms  Low suspicion for stomach bug if you are not experience fever nausea vomiting or diarrhea  Able to tolerate significant pressure along the abdomen low suspicion for an inflamed or infected organ  Tomorrow please go to the [facility] outpatient center for imaging, may leave if once completed you will be contacted via telephone  Return to clinic with stool sample which will be sent off at that time  Empirically begin azithromycin  for bacterial coverage  May use Tylenol  and/or Motrin as needed for pain  May apply warm compresses over the affected area 10 to 15-minute intervals  If you continue to have abdominal pain you may follow-up with gastrointestinal  May follow up with gynecologist if all testing today is negative  ED Prescriptions     Medication Sig Dispense Auth. Provider   azithromycin  (ZITHROMAX ) 250 MG tablet Take 2 tablets (500 mg total) by mouth daily for 1 day, THEN 1 tablet (250 mg total) daily for 4 days. 6 tablet Frankie Zito R, NP      PDMP not reviewed this encounter.   Reena Canning, NP 05/05/24 [phone]

## 2024-05-05 NOTE — Discharge Instructions (Addendum)
 You waited for your abdominal pain, unknown cause at this time  Low suspicion for urinary infection or kidney infection as you do not have any urinary symptoms  Low suspicion for stomach bug if you are not experience fever nausea vomiting or diarrhea  Able to tolerate significant pressure along the abdomen low suspicion for an inflamed or infected organ  Tomorrow please go to the [facility] outpatient center for imaging, may leave if once completed you will be contacted via telephone  Return to clinic with stool sample which will be sent off at that time  Empirically begin azithromycin  for bacterial coverage  May use Tylenol  and/or Motrin as needed for pain  May apply warm compresses over the affected area 10 to 15-minute intervals  If you continue to have abdominal pain you may follow-up with gastrointestinal  May follow up with gynecologist if all testing today is negative

## 2024-05-06 ENCOUNTER — Telehealth: Payer: Self-pay | Admitting: Emergency Medicine

## 2024-05-06 ENCOUNTER — Ambulatory Visit (HOSPITAL_COMMUNITY)
Admission: RE | Admit: 2024-05-06 | Discharge: 2024-05-06 | Disposition: A | Discharge status: Home / Self Care | Source: Ambulatory Visit | Attending: Emergency Medicine | Admitting: Emergency Medicine

## 2024-05-06 ENCOUNTER — Ambulatory Visit (HOSPITAL_COMMUNITY)

## 2024-05-06 ENCOUNTER — Ambulatory Visit (HOSPITAL_COMMUNITY): Admission: RE | Admit: 2024-05-06 | Source: Ambulatory Visit

## 2024-05-06 ENCOUNTER — Ambulatory Visit: Payer: Self-pay

## 2024-05-06 DIAGNOSIS — D259 Leiomyoma of uterus, unspecified: Secondary | ICD-10-CM | POA: Insufficient documentation

## 2024-05-06 DIAGNOSIS — R1031 Right lower quadrant pain: Secondary | ICD-10-CM | POA: Insufficient documentation

## 2024-05-06 DIAGNOSIS — N852 Hypertrophy of uterus: Secondary | ICD-10-CM | POA: Insufficient documentation

## 2024-05-06 DIAGNOSIS — K760 Fatty (change of) liver, not elsewhere classified: Secondary | ICD-10-CM | POA: Diagnosis not present

## 2024-05-06 NOTE — Telephone Encounter (Signed)
 Discussed imaging with the radiology tech, initial order was abdominal complete which would not visualize the right lower quadrant therefore changed to pelvic ultrasound

## 2024-05-06 NOTE — Telephone Encounter (Signed)
 Returns to sample to clinic

## 2024-05-07 ENCOUNTER — Ambulatory Visit: Payer: Self-pay

## 2024-05-07 LAB — GASTROINTESTINAL PANEL BY PCR, STOOL (REPLACES STOOL CULTURE)

## 2024-05-08 ENCOUNTER — Other Ambulatory Visit: Payer: Self-pay

## 2024-05-09 ENCOUNTER — Other Ambulatory Visit: Payer: Self-pay

## 2024-05-09 DIAGNOSIS — F900 Attention-deficit hyperactivity disorder, predominantly inattentive type: Secondary | ICD-10-CM | POA: Diagnosis not present

## 2024-05-09 DIAGNOSIS — F330 Major depressive disorder, recurrent, mild: Secondary | ICD-10-CM | POA: Diagnosis not present

## 2024-05-09 DIAGNOSIS — F411 Generalized anxiety disorder: Secondary | ICD-10-CM | POA: Diagnosis not present

## 2024-05-09 MED ORDER — DEXMETHYLPHENIDATE HCL ER 20 MG PO CP24
20.0000 mg | ORAL_CAPSULE | Freq: Every morning | ORAL | 0 refills | Status: DC
  Filled 2024-05-09 – 2024-05-23 (×2): qty 30, 30d supply, fill #0

## 2024-05-20 ENCOUNTER — Other Ambulatory Visit: Payer: Self-pay

## 2024-05-23 ENCOUNTER — Other Ambulatory Visit: Payer: Self-pay

## 2024-05-23 DIAGNOSIS — Z1321 Encounter for screening for nutritional disorder: Secondary | ICD-10-CM | POA: Diagnosis not present

## 2024-05-23 DIAGNOSIS — Z13220 Encounter for screening for lipoid disorders: Secondary | ICD-10-CM | POA: Diagnosis not present

## 2024-05-23 DIAGNOSIS — Z1329 Encounter for screening for other suspected endocrine disorder: Secondary | ICD-10-CM | POA: Diagnosis not present

## 2024-05-23 DIAGNOSIS — Z13228 Encounter for screening for other metabolic disorders: Secondary | ICD-10-CM | POA: Diagnosis not present

## 2024-05-23 DIAGNOSIS — Z131 Encounter for screening for diabetes mellitus: Secondary | ICD-10-CM | POA: Diagnosis not present

## 2024-05-26 DIAGNOSIS — Z30431 Encounter for routine checking of intrauterine contraceptive device: Secondary | ICD-10-CM | POA: Diagnosis not present

## 2024-06-09 DIAGNOSIS — N764 Abscess of vulva: Secondary | ICD-10-CM | POA: Diagnosis not present

## 2024-06-18 ENCOUNTER — Other Ambulatory Visit: Payer: Self-pay

## 2024-06-18 DIAGNOSIS — D2362 Other benign neoplasm of skin of left upper limb, including shoulder: Secondary | ICD-10-CM | POA: Diagnosis not present

## 2024-06-18 DIAGNOSIS — D2371 Other benign neoplasm of skin of right lower limb, including hip: Secondary | ICD-10-CM | POA: Diagnosis not present

## 2024-06-18 DIAGNOSIS — L814 Other melanin hyperpigmentation: Secondary | ICD-10-CM | POA: Diagnosis not present

## 2024-06-18 DIAGNOSIS — L821 Other seborrheic keratosis: Secondary | ICD-10-CM | POA: Diagnosis not present

## 2024-06-18 DIAGNOSIS — L732 Hidradenitis suppurativa: Secondary | ICD-10-CM | POA: Diagnosis not present

## 2024-06-18 MED ORDER — DOXYCYCLINE HYCLATE 100 MG PO TABS
100.0000 mg | ORAL_TABLET | Freq: Two times a day (BID) | ORAL | 2 refills | Status: AC
  Filled 2024-06-18: qty 60, 30d supply, fill #0
  Filled 2024-07-31: qty 60, 30d supply, fill #1
  Filled 2024-08-04: qty 60, 30d supply, fill #0

## 2024-06-19 ENCOUNTER — Other Ambulatory Visit: Payer: Self-pay

## 2024-06-19 MED ORDER — DEXMETHYLPHENIDATE HCL ER 20 MG PO CP24
20.0000 mg | ORAL_CAPSULE | Freq: Every morning | ORAL | 0 refills | Status: AC
  Filled 2024-07-31: qty 30, 30d supply, fill #0

## 2024-06-19 MED ORDER — DEXMETHYLPHENIDATE HCL ER 20 MG PO CP24
20.0000 mg | ORAL_CAPSULE | Freq: Every morning | ORAL | 0 refills | Status: AC
  Filled 2024-06-19 (×2): qty 30, 30d supply, fill #0

## 2024-06-27 ENCOUNTER — Ambulatory Visit: Admitting: Family Medicine

## 2024-07-18 ENCOUNTER — Encounter: Payer: Self-pay | Admitting: Family Medicine

## 2024-07-18 ENCOUNTER — Ambulatory Visit: Admitting: Family Medicine

## 2024-07-18 ENCOUNTER — Other Ambulatory Visit (HOSPITAL_BASED_OUTPATIENT_CLINIC_OR_DEPARTMENT_OTHER): Payer: Self-pay

## 2024-07-18 VITALS — BP 110/70 | HR 56 | Temp 98.0°F | Ht 64.5 in | Wt 168.8 lb

## 2024-07-18 DIAGNOSIS — M545 Low back pain, unspecified: Secondary | ICD-10-CM

## 2024-07-18 DIAGNOSIS — Z0000 Encounter for general adult medical examination without abnormal findings: Secondary | ICD-10-CM

## 2024-07-18 MED ORDER — METHOCARBAMOL 500 MG PO TABS
500.0000 mg | ORAL_TABLET | Freq: Three times a day (TID) | ORAL | 0 refills | Status: AC
  Filled 2024-07-18: qty 15, 5d supply, fill #0

## 2024-07-18 MED ORDER — PREDNISONE 10 MG PO TABS
ORAL_TABLET | ORAL | 0 refills | Status: AC
  Filled 2024-07-18: qty 21, 6d supply, fill #0

## 2024-07-18 NOTE — Progress Notes (Signed)
 New Patient Office Visit  Subjective    Patient ID: Samantha Webster, female    DOB: 08-06-1986  Age: 38 y.o. MRN: [id number]  CC:  Chief Complaint  Patient presents with   Establish Care   Back Pain    HPI Samantha Webster presents to establish care  Previous PCP: Dr. Charlett   Specialist: Psychiatrist- insomnia and ADHD Laymon Glatter [facility]-Dr. Norleen Skill [facility]  Neurosurgeon- Dr. [facility]   Samantha Webster has complaints of lower back pain. She was diagnosed with a herniated disc in the early 2000's. She has been having flare ups since. She has been in a flare up for 3 weeks. While she was participating in her work out program she felt a pop and felt pain and tightness. She has applied heat and ice as well as continued to participate in core strengthening classes. She has tried anti-inflammatories, muscle relaxers and prednisone  in the past. Medications have improved symptoms. She has tried dry needling once before and she reports that this made her nauseated.  She has has sciatica in the past, but not now. This is affecting her activities of daily living due to the fear of exacerbation of symptoms.    Outpatient Encounter Medications as of 07/18/2024  Medication Sig   dexmethylphenidate  (FOCALIN  XR) 20 MG 24 hr capsule Take 1 capsule (20 mg total) by mouth in the morning.   dexmethylphenidate  (FOCALIN  XR) 20 MG 24 hr capsule Take 1 capsule (20 mg total) by mouth in the morning.   doxycycline  (VIBRA -TABS) 100 MG tablet Take 1 tablet (100 mg total) by mouth 2 (two) times daily with food and a full glass of water as needed for flares.   methocarbamol  (ROBAXIN ) 500 MG tablet Take 1 tablet (500 mg total) by mouth 3 (three) times daily for 5 days.   predniSONE  (DELTASONE ) 10 MG tablet Take 6 tablets (60 mg total) by mouth daily with breakfast for 1 day, THEN 5 tablets (50 mg total) daily  with breakfast for 1 day, THEN 4 tablets (40 mg total) daily with breakfast for 1 day, THEN 3 tablets (30 mg total) daily with breakfast for 1 day, THEN 2 tablets (20 mg total) daily with breakfast for 1 day, THEN 1 tablet (10 mg total) daily with breakfast for 1 day.   traZODone  (DESYREL ) 50 MG tablet Take 3 tablets (150 mg total) by mouth Nightly for sleep.   Turmeric (QC TUMERIC COMPLEX PO) Take 450 tablets by mouth daily. (Patient not taking: Reported on 04/10/2023)   [DISCONTINUED] ALPRAZolam  (XANAX ) 1 MG tablet Take 1 tablet (1 mg total) by mouth at bedtime as needed for anxiety (avoid regular use). (Patient not taking: Reported on 04/10/2023)   [DISCONTINUED] Cholecalciferol (VITAMIN D) 50 MCG (2000 UT) tablet Take 2,000 Units by mouth daily.   [DISCONTINUED] cyclobenzaprine  (FLEXERIL ) 10 MG tablet Take 1 tablet (10 mg total) by mouth 2 (two) times daily as needed for muscle spasms. (Patient not taking: Reported on 04/10/2023)   [DISCONTINUED] dexmethylphenidate  (FOCALIN  XR) 15 MG 24 hr capsule Take 15 mg by mouth every morning.   [DISCONTINUED] diphenhydrAMINE HCl, Sleep, 50 MG CAPS Unisom (diphenhydramine) (Patient not taking: Reported on 04/10/2023)   [DISCONTINUED] lidocaine  (LIDODERM ) 5 % Place 1 patch onto the skin daily. Remove & Discard patch within 12 hours or as directed by MD (Patient not taking: Reported on 04/10/2023)   [DISCONTINUED] Magnesium 100 MG CAPS Take 2 capsules  by mouth daily. (Patient not taking: Reported on 04/10/2023)   [DISCONTINUED] metaxalone (SKELAXIN) 800 MG tablet Take 800 mg by mouth as needed.  (Patient not taking: Reported on 04/10/2023)   [DISCONTINUED] methylPREDNISolone  (MEDROL  DOSEPAK) 4 MG TBPK tablet As directed (Patient not taking: Reported on 04/10/2023)   [DISCONTINUED] omeprazole  (PRILOSEC) 40 MG capsule Take 1 capsule by mouth daily. **Please schedule appointment for further refills.** (Patient not taking: Reported on 04/10/2023)   [DISCONTINUED] sertraline   (ZOLOFT ) 50 MG tablet Take 1 tablet (50 mg total) by mouth in the morning.   [DISCONTINUED] traMADol (ULTRAM) 50 MG tablet Take 50 mg by mouth every 8 (eight) hours as needed. (Patient not taking: Reported on 04/10/2023)   [DISCONTINUED] traZODone  (DESYREL ) 50 MG tablet TAKE 1 TABLET (50 MG TOTAL) BY MOUTH AT BEDTIME. CAN INCREASE TO 75 MG THEN 100 MG DOSE AS NEEDED (Patient not taking: Reported on 04/10/2023)   [DISCONTINUED] Vilazodone  HCl (VIIBRYD ) 40 MG TABS Take 1 tablet (40 mg total) by mouth daily.   No facility-administered encounter medications on file as of 07/18/2024.    Past Medical History:  Diagnosis Date   ADHD    Anxiety    Depression    Family history of breast cancer    Insomnia    Suppurative hidradenitis     Past Surgical History:  Procedure Laterality Date   CESAREAN SECTION     LAPAROSCOPIC ABLATION OF UTERINE FIBROIDS WITH ACESSA      Family History  Problem Relation Age of Onset   Breast cancer Mother 15       deceased early onset.    Breast cancer Maternal Grandmother 69       CHEK2 pos   Skin cancer Maternal Grandfather    Scleroderma Paternal Grandmother    Breast cancer Other        paternal great aunt dx over 65   Breast cancer Other        MGM's mother   Prostate cancer Other        MGMs father    Social History   Socioeconomic History   Marital status: Married    Spouse name: Not on file   Number of children: Not on file   Years of education: Not on file   Highest education level: Some college, no degree  Occupational History   Not on file  Tobacco Use   Smoking status: Never   Smokeless tobacco: Never  Vaping Use   Vaping status: Never Used  Substance and Sexual Activity   Alcohol use: Not Currently    Comment: 6 beverages a week   Drug use: No   Sexual activity: Yes  Other Topics Concern   Not on file  Social History Narrative   Married    Works day care infant some toddlers    Social Drivers of Research scientist (physical sciences) Strain: Medium Risk (07/11/2024)   Overall Financial Resource Strain (CARDIA)    Difficulty of Paying Living Expenses: Somewhat hard  Food Insecurity: No Food Insecurity (07/11/2024)   Hunger Vital Sign    Worried About Running Out of Food in the Last Year: Never true    Ran Out of Food in the Last Year: Never true  Transportation Needs: No Transportation Needs (07/11/2024)   PRAPARE - Administrator, Civil Service (Medical): No    Lack of Transportation (Non-Medical): No  Physical Activity: Sufficiently Active (07/11/2024)   Exercise Vital Sign    Days of Exercise per  Week: 3 days    Minutes of Exercise per Session: 60 min  Stress: Stress Concern Present (07/11/2024)   [employer] of Occupational Health - Occupational Stress Questionnaire    Feeling of Stress: To some extent  Social Connections: Moderately Integrated (07/11/2024)   Social Connection and Isolation Panel    Frequency of Communication with Friends and Family: More than three times a week    Frequency of Social Gatherings with Friends and Family: Once a week    Attends Religious Services: 1 to 4 times per year    Active Member of [employer] or Organizations: No    Attends Engineer, structural: Not on file    Marital Status: Married  Catering manager Violence: Not on file    Review of Systems  Respiratory:  Negative for shortness of breath.   Cardiovascular:  Negative for chest pain.  Musculoskeletal:  Positive for back pain.   Objective   BP 110/70   Pulse (!) 56   Temp 98 F (36.7 C) (Oral)   Ht 5' 4.5 (1.638 m)   Wt 168 lb 12.8 oz (76.6 kg)   LMP 07/03/2024 (Exact Date)   SpO2 99%   BMI 28.53 kg/m   Physical Exam Constitutional:      Appearance: Normal appearance.  HENT:     Head: Normocephalic and atraumatic.  Eyes:     Extraocular Movements: Extraocular movements intact.     Conjunctiva/sclera: Conjunctivae normal.  Cardiovascular:     Rate and Rhythm: Normal rate and  regular rhythm.     Pulses: Normal pulses.     Heart sounds: Normal heart sounds.  Pulmonary:     Effort: Pulmonary effort is normal.     Breath sounds: Normal breath sounds.  Musculoskeletal:        General: Normal range of motion.     Cervical back: Normal range of motion.  Skin:    General: Skin is warm and dry.     Capillary Refill: Capillary refill takes less than 2 seconds.  Neurological:     Mental Status: She is alert and oriented to person, place, and time.  Psychiatric:        Mood and Affect: Mood normal.        Behavior: Behavior normal.        Thought Content: Thought content normal.        Judgment: Judgment normal.    Assessment & Plan:   Problem List Items Addressed This Visit   None Visit Diagnoses       Low back pain without sciatica, unspecified back pain laterality, unspecified chronicity    -  Primary   Relevant Medications   methocarbamol  (ROBAXIN ) 500 MG tablet   predniSONE  (DELTASONE ) 10 MG tablet     Encounter for medical examination to establish care          - Robaxin  and prednisone  prescription sent to preferred pharmacy for back pain.  - Will [facility] labs.  - Possible physical therapy referral in the future. Patient will message me when ready to participate in physical therapy for low back pain.  - Will refer back to [facility] the first of the year when she changes her insurance.    Health Maintenance reviewed - Does not participate in covid 19 boosters.  -TDAP- greater than 10 years.  -MMR - received through [facility] health at work. -Flu shot- obtained last year.  -Pap smear and mammogram May  2025.  Will request records Return in about 1 year (around 07/18/2025).   JoAnna Williamson, NP

## 2024-07-18 NOTE — Patient Instructions (Signed)
 It was a pleasure seeing you today! I look forward to taking care of you!    -Robaxin  and prednisone  prescription sent to preferred pharmacy for back pain.  - Will request records from [facility] for recent labs.  - Possible physical therapy referral in the future. Please send me a message me when ready to participate in physical therapy for lower back pain.  - Will refer back to [facility] the first of the year with insurance change.  Return in 1 year or sooner if you have other concerns.

## 2024-07-25 ENCOUNTER — Other Ambulatory Visit (HOSPITAL_BASED_OUTPATIENT_CLINIC_OR_DEPARTMENT_OTHER): Payer: Self-pay

## 2024-07-25 ENCOUNTER — Encounter: Payer: Self-pay | Admitting: Family Medicine

## 2024-07-25 ENCOUNTER — Other Ambulatory Visit: Payer: Self-pay

## 2024-07-25 ENCOUNTER — Encounter (HOSPITAL_BASED_OUTPATIENT_CLINIC_OR_DEPARTMENT_OTHER): Payer: Self-pay

## 2024-07-25 ENCOUNTER — Other Ambulatory Visit: Payer: Self-pay | Admitting: Family Medicine

## 2024-07-25 DIAGNOSIS — M545 Low back pain, unspecified: Secondary | ICD-10-CM

## 2024-07-25 MED ORDER — MELOXICAM 7.5 MG PO TABS
7.5000 mg | ORAL_TABLET | Freq: Two times a day (BID) | ORAL | 0 refills | Status: DC
  Filled 2024-07-25 – 2024-07-26 (×2): qty 14, 7d supply, fill #0

## 2024-07-26 ENCOUNTER — Other Ambulatory Visit (HOSPITAL_BASED_OUTPATIENT_CLINIC_OR_DEPARTMENT_OTHER): Payer: Self-pay

## 2024-07-28 ENCOUNTER — Other Ambulatory Visit: Payer: Self-pay

## 2024-07-31 ENCOUNTER — Other Ambulatory Visit: Payer: Self-pay

## 2024-08-01 ENCOUNTER — Other Ambulatory Visit (HOSPITAL_BASED_OUTPATIENT_CLINIC_OR_DEPARTMENT_OTHER): Payer: Self-pay

## 2024-08-01 ENCOUNTER — Other Ambulatory Visit: Payer: Self-pay

## 2024-08-01 MED ORDER — DEXMETHYLPHENIDATE HCL ER 20 MG PO CP24
20.0000 mg | ORAL_CAPSULE | Freq: Every morning | ORAL | 0 refills | Status: DC
  Filled 2024-08-01 (×2): qty 30, 30d supply, fill #0

## 2024-08-04 ENCOUNTER — Other Ambulatory Visit: Payer: Self-pay

## 2024-08-04 ENCOUNTER — Other Ambulatory Visit (HOSPITAL_BASED_OUTPATIENT_CLINIC_OR_DEPARTMENT_OTHER): Payer: Self-pay

## 2024-08-05 ENCOUNTER — Other Ambulatory Visit (HOSPITAL_BASED_OUTPATIENT_CLINIC_OR_DEPARTMENT_OTHER): Payer: Self-pay

## 2024-08-12 ENCOUNTER — Other Ambulatory Visit: Payer: Self-pay

## 2024-09-03 ENCOUNTER — Other Ambulatory Visit (HOSPITAL_BASED_OUTPATIENT_CLINIC_OR_DEPARTMENT_OTHER): Payer: Self-pay

## 2024-09-04 ENCOUNTER — Other Ambulatory Visit: Payer: Self-pay

## 2024-09-04 MED ORDER — DEXMETHYLPHENIDATE HCL ER 20 MG PO CP24
20.0000 mg | ORAL_CAPSULE | Freq: Every morning | ORAL | 0 refills | Status: AC
  Filled 2024-09-04: qty 30, 30d supply, fill #0

## 2024-09-19 ENCOUNTER — Other Ambulatory Visit (HOSPITAL_BASED_OUTPATIENT_CLINIC_OR_DEPARTMENT_OTHER): Payer: Self-pay

## 2024-09-19 DIAGNOSIS — F3342 Major depressive disorder, recurrent, in full remission: Secondary | ICD-10-CM | POA: Diagnosis not present

## 2024-09-19 DIAGNOSIS — F900 Attention-deficit hyperactivity disorder, predominantly inattentive type: Secondary | ICD-10-CM | POA: Diagnosis not present

## 2024-09-19 MED ORDER — TRAZODONE HCL 50 MG PO TABS
150.0000 mg | ORAL_TABLET | Freq: Every day | ORAL | 1 refills | Status: AC
  Filled 2024-09-19: qty 270, 90d supply, fill #0
  Filled 2024-10-11: qty 180, 60d supply, fill #0
  Filled 2024-12-12: qty 180, 60d supply, fill #1

## 2024-09-26 ENCOUNTER — Other Ambulatory Visit (HOSPITAL_BASED_OUTPATIENT_CLINIC_OR_DEPARTMENT_OTHER): Payer: Self-pay

## 2024-09-26 DIAGNOSIS — Z79899 Other long term (current) drug therapy: Secondary | ICD-10-CM | POA: Diagnosis not present

## 2024-09-29 ENCOUNTER — Other Ambulatory Visit (HOSPITAL_BASED_OUTPATIENT_CLINIC_OR_DEPARTMENT_OTHER): Payer: Self-pay

## 2024-09-29 MED ORDER — DEXMETHYLPHENIDATE HCL ER 20 MG PO CP24
20.0000 mg | ORAL_CAPSULE | Freq: Every morning | ORAL | 0 refills | Status: AC
  Filled 2024-10-11: qty 30, 30d supply, fill #0

## 2024-09-29 MED ORDER — DEXMETHYLPHENIDATE HCL ER 20 MG PO CP24
20.0000 mg | ORAL_CAPSULE | Freq: Every morning | ORAL | 0 refills | Status: DC
  Filled 2024-11-12: qty 30, 30d supply, fill #0

## 2024-10-11 ENCOUNTER — Other Ambulatory Visit (HOSPITAL_BASED_OUTPATIENT_CLINIC_OR_DEPARTMENT_OTHER): Payer: Self-pay

## 2024-10-21 ENCOUNTER — Other Ambulatory Visit: Payer: Self-pay | Admitting: Family Medicine

## 2024-10-21 DIAGNOSIS — M545 Low back pain, unspecified: Secondary | ICD-10-CM

## 2024-10-23 ENCOUNTER — Other Ambulatory Visit (HOSPITAL_BASED_OUTPATIENT_CLINIC_OR_DEPARTMENT_OTHER): Payer: Self-pay

## 2024-10-23 MED ORDER — MELOXICAM 7.5 MG PO TABS
7.5000 mg | ORAL_TABLET | Freq: Two times a day (BID) | ORAL | 0 refills | Status: DC
Start: 2024-10-23 — End: 2024-11-12
  Filled 2024-10-23: qty 14, 7d supply, fill #0

## 2024-11-12 ENCOUNTER — Other Ambulatory Visit: Payer: Self-pay

## 2024-11-12 ENCOUNTER — Other Ambulatory Visit: Payer: Self-pay | Admitting: Family Medicine

## 2024-11-12 ENCOUNTER — Other Ambulatory Visit (HOSPITAL_BASED_OUTPATIENT_CLINIC_OR_DEPARTMENT_OTHER): Payer: Self-pay

## 2024-11-12 DIAGNOSIS — M545 Low back pain, unspecified: Secondary | ICD-10-CM

## 2024-11-12 MED ORDER — MELOXICAM 7.5 MG PO TABS
7.5000 mg | ORAL_TABLET | Freq: Two times a day (BID) | ORAL | 0 refills | Status: AC
  Filled 2024-11-12: qty 14, 7d supply, fill #0

## 2024-12-17 ENCOUNTER — Other Ambulatory Visit (HOSPITAL_BASED_OUTPATIENT_CLINIC_OR_DEPARTMENT_OTHER): Payer: Self-pay

## 2024-12-19 ENCOUNTER — Other Ambulatory Visit (HOSPITAL_BASED_OUTPATIENT_CLINIC_OR_DEPARTMENT_OTHER): Payer: Self-pay

## 2024-12-19 MED ORDER — DEXMETHYLPHENIDATE HCL ER 20 MG PO CP24
20.0000 mg | ORAL_CAPSULE | Freq: Every morning | ORAL | 0 refills | Status: DC
  Filled 2024-12-19: qty 30, 30d supply, fill #0

## 2025-01-16 ENCOUNTER — Other Ambulatory Visit (HOSPITAL_BASED_OUTPATIENT_CLINIC_OR_DEPARTMENT_OTHER): Payer: Self-pay

## 2025-01-16 MED ORDER — DEXMETHYLPHENIDATE HCL ER 20 MG PO CP24
20.0000 mg | ORAL_CAPSULE | Freq: Every morning | ORAL | 0 refills | Status: AC
  Filled 2025-01-16: qty 30, 30d supply, fill #0
